# Patient Record
Sex: Male | Born: 1985
Health system: Southern US, Community
[De-identification: ages and names within clinical notes are randomized; demographics above are authoritative.]

## PROBLEM LIST (undated history)

## (undated) DIAGNOSIS — K219 Gastro-esophageal reflux disease without esophagitis: Secondary | ICD-10-CM

## (undated) DIAGNOSIS — R110 Nausea: Secondary | ICD-10-CM

## (undated) DIAGNOSIS — F418 Other specified anxiety disorders: Secondary | ICD-10-CM

## (undated) DIAGNOSIS — Z6834 Body mass index (BMI) 34.0-34.9, adult: Secondary | ICD-10-CM

## (undated) DIAGNOSIS — F5101 Primary insomnia: Secondary | ICD-10-CM

## (undated) DIAGNOSIS — E782 Mixed hyperlipidemia: Secondary | ICD-10-CM

## (undated) DIAGNOSIS — G4733 Obstructive sleep apnea (adult) (pediatric): Secondary | ICD-10-CM

## (undated) DIAGNOSIS — F329 Major depressive disorder, single episode, unspecified: Secondary | ICD-10-CM

## (undated) DIAGNOSIS — Z9989 Dependence on other enabling machines and devices: Secondary | ICD-10-CM

## (undated) DIAGNOSIS — E291 Testicular hypofunction: Secondary | ICD-10-CM

## (undated) DIAGNOSIS — R748 Abnormal levels of other serum enzymes: Secondary | ICD-10-CM

## (undated) HISTORY — PX: OTHER SURGICAL HISTORY: SHX169

## (undated) HISTORY — DX: Obstructive sleep apnea (adult) (pediatric): Z99.89

## (undated) HISTORY — DX: Abnormal levels of other serum enzymes: R74.8

## (undated) HISTORY — DX: Gastro-esophageal reflux disease without esophagitis: K21.9

## (undated) HISTORY — DX: Obstructive sleep apnea (adult) (pediatric): G47.33

## (undated) HISTORY — PX: SHOULDER SURGERY: SHX246

---
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
## 2003-09-26 ENCOUNTER — Emergency Department (HOSPITAL_COMMUNITY): Admission: EM | Admit: 2003-09-26 | Discharge: 2003-09-26 | Payer: Self-pay | Admitting: Emergency Medicine

## 2005-02-11 ENCOUNTER — Encounter: Admission: RE | Admit: 2005-02-11 | Discharge: 2005-02-11 | Payer: Self-pay | Admitting: Internal Medicine

## 2007-01-18 ENCOUNTER — Emergency Department (HOSPITAL_COMMUNITY): Admission: EM | Admit: 2007-01-18 | Discharge: 2007-01-18 | Payer: Self-pay | Admitting: *Deleted

## 2010-04-25 ENCOUNTER — Emergency Department (HOSPITAL_COMMUNITY)
Admission: EM | Admit: 2010-04-25 | Discharge: 2010-04-25 | Payer: Self-pay | Source: Home / Self Care | Admitting: Emergency Medicine

## 2010-09-12 LAB — BASIC METABOLIC PANEL
BUN: 10 mg/dL (ref 6–23)
CO2: 28 mEq/L (ref 19–32)
Calcium: 9.5 mg/dL (ref 8.4–10.5)
Chloride: 104 mEq/L (ref 96–112)
Creatinine, Ser: 0.92 mg/dL (ref 0.4–1.5)
GFR calc Af Amer: >60 mL/min (ref >60)
GFR calc non Af Amer: >60 mL/min (ref >60)
Glucose, Bld: 101 mg/dL — ABNORMAL HIGH (ref 70–99)
Potassium: 3.8 mEq/L (ref 3.5–5.1)
Sodium: 140 mEq/L (ref 135–145)

## 2010-09-12 LAB — CBC
HCT: 42.9 % (ref 39.0–52.0)
Hemoglobin: 14.6 g/dL (ref 13.0–17.0)
MCH: 29 pg (ref 26.0–34.0)
MCHC: 33.9 g/dL (ref 30.0–36.0)
MCV: 85.5 fL (ref 78.0–100.0)
Platelets: 267 10*3/uL (ref 150–400)
RBC: 5.02 MIL/uL (ref 4.22–5.81)
RDW: 13.4 % (ref 11.5–15.5)
WBC: 10.4 10*3/uL (ref 4.0–10.5)

## 2010-09-12 LAB — DIFFERENTIAL
Basophils Absolute: 0 10*3/uL (ref 0.0–0.1)
Basophils Relative: 0 % (ref 0–1)
Eosinophils Absolute: 0 10*3/uL (ref 0.0–0.7)
Eosinophils Relative: 0 % (ref 0–5)
Lymphocytes Relative: 11 % — ABNORMAL LOW (ref 12–46)
Lymphs Abs: 1.2 10*3/uL (ref 0.7–4.0)
Monocytes Absolute: 0.3 10*3/uL (ref 0.1–1.0)
Monocytes Relative: 3 % (ref 3–12)
Neutro Abs: 8.9 10*3/uL — ABNORMAL HIGH (ref 1.7–7.7)
Neutrophils Relative %: 85 % — ABNORMAL HIGH (ref 43–77)

## 2010-09-12 LAB — POCT CARDIAC MARKERS
CKMB, poc: <1 ng/mL — ABNORMAL LOW (ref 1.0–8.0)
Myoglobin, poc: 53.6 ng/mL (ref 12–200)
Troponin i, poc: <0.05 ng/mL (ref 0.00–0.09)

## 2010-09-12 LAB — URINALYSIS, ROUTINE W REFLEX MICROSCOPIC
Bilirubin Urine: NEGATIVE
Glucose, UA: NEGATIVE mg/dL
Hgb urine dipstick: NEGATIVE
Ketones, ur: NEGATIVE mg/dL
Nitrite: NEGATIVE
Protein, ur: NEGATIVE mg/dL
Specific Gravity, Urine: 1.003 — ABNORMAL LOW (ref 1.005–1.030)
Urobilinogen, UA: 0.2 mg/dL (ref 0.0–1.0)
pH: 7 (ref 5.0–8.0)

## 2010-09-12 LAB — GLUCOSE, CAPILLARY: Glucose-Capillary: 107 mg/dL — ABNORMAL HIGH (ref 70–99)

## 2011-04-15 LAB — DIFFERENTIAL
Basophils Absolute: 0
Basophils Relative: 0
Eosinophils Absolute: 0
Eosinophils Relative: 0
Lymphocytes Relative: 41
Lymphs Abs: 1.7
Monocytes Absolute: 0.6
Monocytes Relative: 14 — ABNORMAL HIGH
Neutro Abs: 1.9
Neutrophils Relative %: 45

## 2011-04-15 LAB — URINALYSIS, ROUTINE W REFLEX MICROSCOPIC
Bilirubin Urine: NEGATIVE
Glucose, UA: NEGATIVE
Hgb urine dipstick: NEGATIVE
Ketones, ur: NEGATIVE
Nitrite: NEGATIVE
Protein, ur: NEGATIVE
Specific Gravity, Urine: 1.004 — ABNORMAL LOW
Urobilinogen, UA: 0.2
pH: 6

## 2011-04-15 LAB — CBC
HCT: 39
Hemoglobin: 13.5
MCHC: 34.6
MCV: 82.9
Platelets: 144 — ABNORMAL LOW
RBC: 4.71
RDW: 13.4
WBC: 4.2

## 2011-07-02 HISTORY — PX: ROTATOR CUFF REPAIR: SHX139

## 2012-01-15 ENCOUNTER — Other Ambulatory Visit: Payer: Self-pay | Admitting: Orthopaedic Surgery

## 2012-01-15 DIAGNOSIS — M25511 Pain in right shoulder: Secondary | ICD-10-CM

## 2012-01-23 ENCOUNTER — Ambulatory Visit
Admission: RE | Admit: 2012-01-23 | Discharge: 2012-01-23 | Disposition: A | Discharge status: Home / Self Care | Payer: 59 | Source: Ambulatory Visit | Attending: Orthopaedic Surgery | Admitting: Orthopaedic Surgery

## 2012-01-23 DIAGNOSIS — M25511 Pain in right shoulder: Secondary | ICD-10-CM

## 2012-01-23 MED ORDER — IOHEXOL 180 MG/ML  SOLN
20.0000 mL | Freq: Once | INTRAMUSCULAR | Status: AC | PRN
  Administered 2012-01-23: 20 mL via INTRA_ARTICULAR

## 2012-09-13 ENCOUNTER — Encounter (HOSPITAL_COMMUNITY): Payer: Self-pay | Admitting: *Deleted

## 2012-09-13 ENCOUNTER — Emergency Department (HOSPITAL_COMMUNITY)
Admission: EM | Admit: 2012-09-13 | Discharge: 2012-09-14 | Disposition: A | Discharge status: Home / Self Care | Payer: Worker's Compensation | Attending: Emergency Medicine | Admitting: Emergency Medicine

## 2012-09-13 ENCOUNTER — Emergency Department (HOSPITAL_COMMUNITY): Payer: Worker's Compensation

## 2012-09-13 DIAGNOSIS — S5000XA Contusion of unspecified elbow, initial encounter: Secondary | ICD-10-CM | POA: Insufficient documentation

## 2012-09-13 DIAGNOSIS — S60222A Contusion of left hand, initial encounter: Secondary | ICD-10-CM

## 2012-09-13 DIAGNOSIS — S60229A Contusion of unspecified hand, initial encounter: Secondary | ICD-10-CM | POA: Insufficient documentation

## 2012-09-13 DIAGNOSIS — S5002XA Contusion of left elbow, initial encounter: Secondary | ICD-10-CM

## 2012-09-13 MED ORDER — IBUPROFEN 200 MG PO TABS
400.0000 mg | ORAL_TABLET | Freq: Once | ORAL | Status: AC
  Administered 2012-09-13: 400 mg via ORAL
  Filled 2012-09-13: qty 2
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
## 2012-09-14 NOTE — ED Notes (Signed)
Ortho tech paged for sling immobilizer 

## 2012-09-14 NOTE — ED Provider Notes (Signed)
Medical screening examination/treatment/procedure(s) were performed by non-physician practitioner and as supervising physician I was immediately available for consultation/collaboration.  Lyanne Co, MD 09/14/12 [phone]

## 2012-09-14 NOTE — Progress Notes (Signed)
Orthopedic Tech Progress Note Patient Details:  Erik Walker 01-08-1986 [id number]  Ortho Devices Type of Ortho Device: Sling immobilizer   Haskell Flirt 09/14/2012, 12:32 AM

## 2012-09-15 ENCOUNTER — Other Ambulatory Visit: Payer: Self-pay | Admitting: Orthopaedic Surgery

## 2012-09-15 DIAGNOSIS — M25511 Pain in right shoulder: Secondary | ICD-10-CM

## 2012-09-29 ENCOUNTER — Ambulatory Visit
Admission: RE | Admit: 2012-09-29 | Discharge: 2012-09-29 | Disposition: A | Discharge status: Home / Self Care | Payer: 59 | Source: Ambulatory Visit | Attending: Orthopaedic Surgery | Admitting: Orthopaedic Surgery

## 2012-09-29 DIAGNOSIS — M25511 Pain in right shoulder: Secondary | ICD-10-CM

## 2012-09-29 MED ORDER — IOHEXOL 180 MG/ML  SOLN
15.0000 mL | Freq: Once | INTRAMUSCULAR | Status: AC | PRN
  Administered 2012-09-29: 15 mL via INTRA_ARTICULAR

## 2013-08-16 DIAGNOSIS — E291 Testicular hypofunction: Secondary | ICD-10-CM

## 2013-08-16 HISTORY — DX: Testicular hypofunction: E29.1

## 2016-01-02 ENCOUNTER — Emergency Department (HOSPITAL_COMMUNITY)
Admission: EM | Admit: 2016-01-02 | Discharge: 2016-01-02 | Disposition: A | Discharge status: Home / Self Care | Payer: 59 | Attending: Emergency Medicine | Admitting: Emergency Medicine

## 2016-01-02 ENCOUNTER — Encounter (HOSPITAL_COMMUNITY): Payer: Self-pay

## 2016-01-02 ENCOUNTER — Emergency Department (HOSPITAL_COMMUNITY): Payer: 59

## 2016-01-02 DIAGNOSIS — Y939 Activity, unspecified: Secondary | ICD-10-CM | POA: Insufficient documentation

## 2016-01-02 DIAGNOSIS — S91112A Laceration without foreign body of left great toe without damage to nail, initial encounter: Secondary | ICD-10-CM

## 2016-01-02 DIAGNOSIS — Y929 Unspecified place or not applicable: Secondary | ICD-10-CM | POA: Insufficient documentation

## 2016-01-02 DIAGNOSIS — Y999 Unspecified external cause status: Secondary | ICD-10-CM | POA: Insufficient documentation

## 2016-01-02 DIAGNOSIS — W25XXXA Contact with sharp glass, initial encounter: Secondary | ICD-10-CM | POA: Insufficient documentation

## 2016-01-02 MED ORDER — LIDOCAINE HCL (PF) 1 % IJ SOLN
5.0000 mL | Freq: Once | INTRAMUSCULAR | Status: AC
  Administered 2016-01-02: 5 mL via INTRADERMAL
  Filled 2016-01-02: qty 30

## 2016-01-02 MED ORDER — TETANUS-DIPHTH-ACELL PERTUSSIS 5-2.5-18.5 LF-MCG/0.5 IM SUSP
0.5000 mL | Freq: Once | INTRAMUSCULAR | Status: AC
  Administered 2016-01-02: 0.5 mL via INTRAMUSCULAR
  Filled 2016-01-02: qty 0.5

## 2016-01-02 MED ORDER — CEPHALEXIN 500 MG PO CAPS
500.0000 mg | ORAL_CAPSULE | Freq: Two times a day (BID) | ORAL | Status: DC
Start: 2016-01-02 — End: 2018-04-28

## 2016-01-02 NOTE — ED Provider Notes (Signed)
CSN: [id number]     Arrival date & time 01/02/16  1551 History  By signing my name below, I, Evelene Croon, attest that this documentation has been prepared under the direction and in the presence of Elnora Morrison, MD . Electronically Signed: Evelene Croon, Scribe. 01/02/2016. 5:26 PM.    Chief Complaint  Patient presents with  . Extremity Laceration    The history is provided by the patient. No language interpreter was used.    HPI Comments:  Erik Walker is a 30 y.o. male who presents to the Emergency Department complaining of a laceration to the top of left great toe . Pt states he stepped on a piece of glass ~ 2 hours ago. He notes mild pain to the site. Tetanus is not UTD. Pt has no other complaints or symptoms at this time.    History reviewed. No pertinent past medical history. Past Surgical History  Procedure Laterality Date  . Shoulder surgery      RIGHT x2   History reviewed. No pertinent family history. Social History  Substance Use Topics  . Smoking status: Never Smoker   . Smokeless tobacco: None  . Alcohol Use: No    Review of Systems  Constitutional: Negative for fever and chills.  Respiratory: Negative for shortness of breath.   Cardiovascular: Negative for chest pain.  Skin: Positive for wound.  All other systems reviewed and are negative.   Allergies  Review of patient's allergies indicates no known allergies.  Home Medications   Prior to Admission medications   Medication Sig Start Date End Date Taking? Authorizing Provider  venlafaxine XR (EFFEXOR-XR) 75 MG 24 hr capsule Take 75 mg by mouth daily.    Historical Provider, MD   BP 130/81 mmHg  Pulse 96  Temp(Src) 98.6 F (37 C) (Oral)  Resp 15  Ht 5\' 10"  (1.778 m)  Wt 195 lb (88.451 kg)  BMI 27.98 kg/m2  SpO2 98% Physical Exam  Constitutional: He is oriented to person, place, and time. He appears well-developed and well-nourished. No distress.  HENT:  Head: Normocephalic and atraumatic.   Eyes: Conjunctivae are normal.  Cardiovascular: Normal rate.   Pulmonary/Chest: Effort normal.  Abdominal: He exhibits no distension.  Musculoskeletal:  5+ strength w DF and plantar flexion of great toe, 2 cm lac lateral great toe. Nl cap refill  Neurological: He is alert and oriented to person, place, and time.  Skin: Skin is warm and dry.  Psychiatric: He has a normal mood and affect.  Nursing note and vitals reviewed.   ED Course  Procedures   DIAGNOSTIC STUDIES:  Oxygen Saturation is 98% on RA, normal by my interpretation.    COORDINATION OF CARE:  5:25 PM Will repair laceration and update tetanus.  Discussed treatment plan with pt at bedside and pt agreed to plan.  Labs Review Labs Reviewed - No data to display  Imaging Review Dg Toe Great Left  01/02/2016  CLINICAL DATA:  Laceration from shattered glass EXAM: LEFT FIRST TOE:  3 V COMPARISON:  None. FINDINGS: Frontal, oblique, and lateral views obtained. There is soft tissue injury medial to the first IP joint. No radiopaque foreign body evident. No fracture or dislocation. The joint spaces appear normal. No erosive change. IMPRESSION: Soft tissue injury medial to the first IP joint. No radiopaque foreign body. No fracture or dislocation. No apparent arthropathy. Electronically Signed   By: Lowella Grip III M.D.   On: 01/02/2016 17:02   I have personally reviewed and  evaluated these images and lab results as part of my medical decision-making.   EKG Interpretation None       LACERATION REPAIR PROCEDURE NOTE The patient's identification was confirmed and consent was obtained. This procedure was performed by Elnora Morrison, MD at 5:26 PM. Site: great toe left Sterile procedures observed yes Anesthetic used (type and amt): lido  Suture type/size:4-0 Length:3 # of Sutures: 5 Technique:inter Complexity Antibx ointment applied no Tetanus UTD or orderedyes Site anesthetized, irrigated with NS, explored without  evidence of foreign body, wound well approximated, site covered with dry, sterile dressing.  Patient tolerated procedure well without complications. Instructions for care discussed verbally and patient provided with additional written instructions for homecare and f/u.   MDM   Final diagnoses:  None   Patient presents with isolated laceration to great toe. Wound cleaned and repaired in the ER. Tetanus updated. Reasons to return discussed  Results and differential diagnosis were discussed with the patient/parent/guardian. Xrays were independently reviewed by myself.  Close follow up outpatient was discussed, comfortable with the plan.   Medications  lidocaine (PF) (XYLOCAINE) 1 % injection 5 mL (not administered)  Tdap (BOOSTRIX) injection 0.5 mL (not administered)    Filed Vitals:   01/02/16 1602  BP: 130/81  Pulse: 96  Temp: 98.6 F (37 C)  TempSrc: Oral  Resp: 15  Height: 5\' 10"  (1.778 m)  Weight: 195 lb (88.451 kg)  SpO2: 98%    Final diagnoses:  Laceration of great toe, left, initial encounter       Elnora Morrison, MD 01/02/16 [phone]

## 2016-01-02 NOTE — Discharge Instructions (Signed)
If you were given medicines take as directed.  If you are on coumadin or contraceptives realize their levels and effectiveness is altered by many different medicines.  If you have any reaction (rash, tongues swelling, other) to the medicines stop taking and see a physician.   Stitches removed in 10 days.  Keep clean, no pools or bath tubs.    If your blood pressure was elevated in the ER make sure you follow up for management with a primary doctor or return for chest pain, shortness of breath or stroke symptoms.  Please follow up as directed and return to the ER or see a physician for new or worsening symptoms.  Thank you. Filed Vitals:   01/02/16 1602  BP: 130/81  Pulse: 96  Temp: 98.6 F (37 C)  TempSrc: Oral  Resp: 15  Height: 5\' 10"  (1.778 m)  Weight: 195 lb (88.451 kg)  SpO2: 98%

## 2016-01-02 NOTE — ED Notes (Signed)
Dressing applied.  Wound care instruction given.
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
## 2016-10-31 DIAGNOSIS — E291 Testicular hypofunction: Secondary | ICD-10-CM | POA: Diagnosis not present

## 2016-11-05 DIAGNOSIS — J0100 Acute maxillary sinusitis, unspecified: Secondary | ICD-10-CM | POA: Diagnosis not present

## 2016-11-27 DIAGNOSIS — M778 Other enthesopathies, not elsewhere classified: Secondary | ICD-10-CM | POA: Diagnosis not present

## 2016-11-28 DIAGNOSIS — R110 Nausea: Secondary | ICD-10-CM | POA: Diagnosis not present

## 2016-11-28 DIAGNOSIS — K5909 Other constipation: Secondary | ICD-10-CM | POA: Diagnosis not present

## 2016-12-25 DIAGNOSIS — M778 Other enthesopathies, not elsewhere classified: Secondary | ICD-10-CM | POA: Diagnosis not present

## 2016-12-28 DIAGNOSIS — M25532 Pain in left wrist: Secondary | ICD-10-CM | POA: Diagnosis not present

## 2017-02-05 DIAGNOSIS — M778 Other enthesopathies, not elsewhere classified: Secondary | ICD-10-CM | POA: Diagnosis not present

## 2017-03-27 DIAGNOSIS — M25532 Pain in left wrist: Secondary | ICD-10-CM | POA: Diagnosis not present

## 2017-05-01 DIAGNOSIS — E291 Testicular hypofunction: Secondary | ICD-10-CM | POA: Diagnosis not present

## 2017-05-01 DIAGNOSIS — Z0000 Encounter for general adult medical examination without abnormal findings: Secondary | ICD-10-CM | POA: Diagnosis not present

## 2017-05-02 DIAGNOSIS — S63592A Other specified sprain of left wrist, initial encounter: Secondary | ICD-10-CM | POA: Diagnosis not present

## 2017-05-02 DIAGNOSIS — M24132 Other articular cartilage disorders, left wrist: Secondary | ICD-10-CM | POA: Diagnosis not present

## 2017-05-07 DIAGNOSIS — E291 Testicular hypofunction: Secondary | ICD-10-CM | POA: Diagnosis not present

## 2017-05-12 DIAGNOSIS — S63592A Other specified sprain of left wrist, initial encounter: Secondary | ICD-10-CM | POA: Diagnosis not present

## 2017-05-14 DIAGNOSIS — M25532 Pain in left wrist: Secondary | ICD-10-CM | POA: Diagnosis not present

## 2017-05-14 DIAGNOSIS — S63592D Other specified sprain of left wrist, subsequent encounter: Secondary | ICD-10-CM | POA: Diagnosis not present

## 2017-05-19 DIAGNOSIS — S63592D Other specified sprain of left wrist, subsequent encounter: Secondary | ICD-10-CM | POA: Diagnosis not present

## 2017-05-19 DIAGNOSIS — M25532 Pain in left wrist: Secondary | ICD-10-CM | POA: Diagnosis not present

## 2017-05-26 DIAGNOSIS — M25532 Pain in left wrist: Secondary | ICD-10-CM | POA: Diagnosis not present

## 2017-05-26 DIAGNOSIS — S63592D Other specified sprain of left wrist, subsequent encounter: Secondary | ICD-10-CM | POA: Diagnosis not present

## 2017-06-03 DIAGNOSIS — S63592D Other specified sprain of left wrist, subsequent encounter: Secondary | ICD-10-CM | POA: Diagnosis not present

## 2017-06-03 DIAGNOSIS — M25532 Pain in left wrist: Secondary | ICD-10-CM | POA: Diagnosis not present

## 2017-06-16 DIAGNOSIS — S63592D Other specified sprain of left wrist, subsequent encounter: Secondary | ICD-10-CM | POA: Diagnosis not present

## 2017-06-16 DIAGNOSIS — M25532 Pain in left wrist: Secondary | ICD-10-CM | POA: Diagnosis not present

## 2017-06-25 DIAGNOSIS — F418 Other specified anxiety disorders: Secondary | ICD-10-CM

## 2017-06-25 DIAGNOSIS — F329 Major depressive disorder, single episode, unspecified: Secondary | ICD-10-CM

## 2017-06-25 HISTORY — DX: Major depressive disorder, single episode, unspecified: F32.9

## 2017-06-25 HISTORY — DX: Other specified anxiety disorders: F41.8

## 2017-06-26 DIAGNOSIS — M25532 Pain in left wrist: Secondary | ICD-10-CM | POA: Diagnosis not present

## 2017-06-26 DIAGNOSIS — S63592D Other specified sprain of left wrist, subsequent encounter: Secondary | ICD-10-CM | POA: Diagnosis not present

## 2017-07-03 DIAGNOSIS — M25532 Pain in left wrist: Secondary | ICD-10-CM | POA: Diagnosis not present

## 2017-07-03 DIAGNOSIS — S63592D Other specified sprain of left wrist, subsequent encounter: Secondary | ICD-10-CM | POA: Diagnosis not present

## 2017-07-07 DIAGNOSIS — S63592D Other specified sprain of left wrist, subsequent encounter: Secondary | ICD-10-CM | POA: Diagnosis not present

## 2017-07-07 DIAGNOSIS — M25532 Pain in left wrist: Secondary | ICD-10-CM | POA: Diagnosis not present

## 2017-07-09 DIAGNOSIS — S63592D Other specified sprain of left wrist, subsequent encounter: Secondary | ICD-10-CM | POA: Diagnosis not present

## 2017-07-09 DIAGNOSIS — M25532 Pain in left wrist: Secondary | ICD-10-CM | POA: Diagnosis not present

## 2017-07-16 DIAGNOSIS — M25532 Pain in left wrist: Secondary | ICD-10-CM | POA: Diagnosis not present

## 2017-07-16 DIAGNOSIS — S63592D Other specified sprain of left wrist, subsequent encounter: Secondary | ICD-10-CM | POA: Diagnosis not present

## 2017-07-21 DIAGNOSIS — M25532 Pain in left wrist: Secondary | ICD-10-CM | POA: Diagnosis not present

## 2017-07-21 DIAGNOSIS — S63592D Other specified sprain of left wrist, subsequent encounter: Secondary | ICD-10-CM | POA: Diagnosis not present

## 2017-07-23 DIAGNOSIS — S63592D Other specified sprain of left wrist, subsequent encounter: Secondary | ICD-10-CM | POA: Diagnosis not present

## 2017-07-23 DIAGNOSIS — M25532 Pain in left wrist: Secondary | ICD-10-CM | POA: Diagnosis not present

## 2017-07-28 DIAGNOSIS — S63592D Other specified sprain of left wrist, subsequent encounter: Secondary | ICD-10-CM | POA: Diagnosis not present

## 2017-07-28 DIAGNOSIS — M25532 Pain in left wrist: Secondary | ICD-10-CM | POA: Diagnosis not present

## 2017-07-30 DIAGNOSIS — S63592D Other specified sprain of left wrist, subsequent encounter: Secondary | ICD-10-CM | POA: Diagnosis not present

## 2017-07-30 DIAGNOSIS — M25532 Pain in left wrist: Secondary | ICD-10-CM | POA: Diagnosis not present

## 2017-08-04 DIAGNOSIS — S63592D Other specified sprain of left wrist, subsequent encounter: Secondary | ICD-10-CM | POA: Diagnosis not present

## 2017-08-04 DIAGNOSIS — M25532 Pain in left wrist: Secondary | ICD-10-CM | POA: Diagnosis not present

## 2017-08-06 DIAGNOSIS — M25532 Pain in left wrist: Secondary | ICD-10-CM | POA: Diagnosis not present

## 2017-08-06 DIAGNOSIS — S63592D Other specified sprain of left wrist, subsequent encounter: Secondary | ICD-10-CM | POA: Diagnosis not present

## 2017-08-11 DIAGNOSIS — S63592D Other specified sprain of left wrist, subsequent encounter: Secondary | ICD-10-CM | POA: Diagnosis not present

## 2017-08-11 DIAGNOSIS — M25532 Pain in left wrist: Secondary | ICD-10-CM | POA: Diagnosis not present

## 2017-08-13 DIAGNOSIS — M25532 Pain in left wrist: Secondary | ICD-10-CM | POA: Diagnosis not present

## 2017-08-13 DIAGNOSIS — S63592D Other specified sprain of left wrist, subsequent encounter: Secondary | ICD-10-CM | POA: Diagnosis not present

## 2017-08-14 DIAGNOSIS — M25532 Pain in left wrist: Secondary | ICD-10-CM | POA: Diagnosis not present

## 2017-09-10 DIAGNOSIS — M25532 Pain in left wrist: Secondary | ICD-10-CM | POA: Diagnosis not present

## 2017-10-15 DIAGNOSIS — E291 Testicular hypofunction: Secondary | ICD-10-CM | POA: Diagnosis not present

## 2017-10-21 DIAGNOSIS — E291 Testicular hypofunction: Secondary | ICD-10-CM | POA: Diagnosis not present

## 2017-10-28 DIAGNOSIS — M9903 Segmental and somatic dysfunction of lumbar region: Secondary | ICD-10-CM | POA: Diagnosis not present

## 2017-10-28 DIAGNOSIS — M25532 Pain in left wrist: Secondary | ICD-10-CM | POA: Diagnosis not present

## 2017-10-28 DIAGNOSIS — M62830 Muscle spasm of back: Secondary | ICD-10-CM | POA: Diagnosis not present

## 2017-10-30 DIAGNOSIS — E291 Testicular hypofunction: Secondary | ICD-10-CM | POA: Diagnosis not present

## 2017-10-30 DIAGNOSIS — M62830 Muscle spasm of back: Secondary | ICD-10-CM | POA: Diagnosis not present

## 2017-10-30 DIAGNOSIS — R400 Somnolence: Secondary | ICD-10-CM | POA: Diagnosis not present

## 2017-10-30 DIAGNOSIS — M9903 Segmental and somatic dysfunction of lumbar region: Secondary | ICD-10-CM | POA: Diagnosis not present

## 2017-10-31 DIAGNOSIS — M62830 Muscle spasm of back: Secondary | ICD-10-CM | POA: Diagnosis not present

## 2017-10-31 DIAGNOSIS — M9903 Segmental and somatic dysfunction of lumbar region: Secondary | ICD-10-CM | POA: Diagnosis not present

## 2017-11-10 DIAGNOSIS — M25511 Pain in right shoulder: Secondary | ICD-10-CM | POA: Diagnosis not present

## 2017-11-10 DIAGNOSIS — M542 Cervicalgia: Secondary | ICD-10-CM | POA: Diagnosis not present

## 2017-12-15 DIAGNOSIS — M62830 Muscle spasm of back: Secondary | ICD-10-CM | POA: Diagnosis not present

## 2017-12-15 DIAGNOSIS — M9903 Segmental and somatic dysfunction of lumbar region: Secondary | ICD-10-CM | POA: Diagnosis not present

## 2017-12-19 DIAGNOSIS — M9903 Segmental and somatic dysfunction of lumbar region: Secondary | ICD-10-CM | POA: Diagnosis not present

## 2017-12-19 DIAGNOSIS — M62830 Muscle spasm of back: Secondary | ICD-10-CM | POA: Diagnosis not present

## 2017-12-24 DIAGNOSIS — M25532 Pain in left wrist: Secondary | ICD-10-CM | POA: Diagnosis not present

## 2017-12-24 DIAGNOSIS — M62830 Muscle spasm of back: Secondary | ICD-10-CM | POA: Diagnosis not present

## 2017-12-24 DIAGNOSIS — M9903 Segmental and somatic dysfunction of lumbar region: Secondary | ICD-10-CM | POA: Diagnosis not present

## 2017-12-26 DIAGNOSIS — G4730 Sleep apnea, unspecified: Secondary | ICD-10-CM | POA: Diagnosis not present

## 2018-01-05 DIAGNOSIS — M62830 Muscle spasm of back: Secondary | ICD-10-CM | POA: Diagnosis not present

## 2018-01-05 DIAGNOSIS — M9903 Segmental and somatic dysfunction of lumbar region: Secondary | ICD-10-CM | POA: Diagnosis not present

## 2018-01-07 DIAGNOSIS — M62830 Muscle spasm of back: Secondary | ICD-10-CM | POA: Diagnosis not present

## 2018-01-07 DIAGNOSIS — M9903 Segmental and somatic dysfunction of lumbar region: Secondary | ICD-10-CM | POA: Diagnosis not present

## 2018-01-08 DIAGNOSIS — M9903 Segmental and somatic dysfunction of lumbar region: Secondary | ICD-10-CM | POA: Diagnosis not present

## 2018-01-08 DIAGNOSIS — M62830 Muscle spasm of back: Secondary | ICD-10-CM | POA: Diagnosis not present

## 2018-01-26 DIAGNOSIS — M9903 Segmental and somatic dysfunction of lumbar region: Secondary | ICD-10-CM | POA: Diagnosis not present

## 2018-01-26 DIAGNOSIS — M62830 Muscle spasm of back: Secondary | ICD-10-CM | POA: Diagnosis not present

## 2018-02-18 DIAGNOSIS — K118 Other diseases of salivary glands: Secondary | ICD-10-CM | POA: Diagnosis not present

## 2018-02-25 DIAGNOSIS — M25532 Pain in left wrist: Secondary | ICD-10-CM | POA: Diagnosis not present

## 2018-03-18 DIAGNOSIS — M9903 Segmental and somatic dysfunction of lumbar region: Secondary | ICD-10-CM | POA: Diagnosis not present

## 2018-03-18 DIAGNOSIS — M9902 Segmental and somatic dysfunction of thoracic region: Secondary | ICD-10-CM | POA: Diagnosis not present

## 2018-03-18 DIAGNOSIS — M5414 Radiculopathy, thoracic region: Secondary | ICD-10-CM | POA: Diagnosis not present

## 2018-03-19 DIAGNOSIS — G4700 Insomnia, unspecified: Secondary | ICD-10-CM | POA: Diagnosis not present

## 2018-03-19 DIAGNOSIS — Z23 Encounter for immunization: Secondary | ICD-10-CM | POA: Diagnosis not present

## 2018-03-19 DIAGNOSIS — G4733 Obstructive sleep apnea (adult) (pediatric): Secondary | ICD-10-CM | POA: Diagnosis not present

## 2018-03-24 DIAGNOSIS — M9903 Segmental and somatic dysfunction of lumbar region: Secondary | ICD-10-CM | POA: Diagnosis not present

## 2018-03-24 DIAGNOSIS — M9902 Segmental and somatic dysfunction of thoracic region: Secondary | ICD-10-CM | POA: Diagnosis not present

## 2018-03-24 DIAGNOSIS — M5414 Radiculopathy, thoracic region: Secondary | ICD-10-CM | POA: Diagnosis not present

## 2018-03-31 DIAGNOSIS — M5414 Radiculopathy, thoracic region: Secondary | ICD-10-CM | POA: Diagnosis not present

## 2018-03-31 DIAGNOSIS — M9902 Segmental and somatic dysfunction of thoracic region: Secondary | ICD-10-CM | POA: Diagnosis not present

## 2018-03-31 DIAGNOSIS — M9903 Segmental and somatic dysfunction of lumbar region: Secondary | ICD-10-CM | POA: Diagnosis not present

## 2018-04-08 ENCOUNTER — Institutional Professional Consult (permissible substitution): Payer: 59 | Admitting: Pulmonary Disease

## 2018-04-10 DIAGNOSIS — M9903 Segmental and somatic dysfunction of lumbar region: Secondary | ICD-10-CM | POA: Diagnosis not present

## 2018-04-10 DIAGNOSIS — M5414 Radiculopathy, thoracic region: Secondary | ICD-10-CM | POA: Diagnosis not present

## 2018-04-10 DIAGNOSIS — M9902 Segmental and somatic dysfunction of thoracic region: Secondary | ICD-10-CM | POA: Diagnosis not present

## 2018-04-16 DIAGNOSIS — F5101 Primary insomnia: Secondary | ICD-10-CM | POA: Diagnosis not present

## 2018-04-24 DIAGNOSIS — M25532 Pain in left wrist: Secondary | ICD-10-CM | POA: Diagnosis not present

## 2018-04-28 ENCOUNTER — Ambulatory Visit (INDEPENDENT_AMBULATORY_CARE_PROVIDER_SITE_OTHER): Payer: 59 | Admitting: Pulmonary Disease

## 2018-04-28 VITALS — BP 116/78 | HR 73 | Ht 70.0 in | Wt 201.0 lb

## 2018-04-28 DIAGNOSIS — G4733 Obstructive sleep apnea (adult) (pediatric): Secondary | ICD-10-CM

## 2018-04-28 DIAGNOSIS — F5101 Primary insomnia: Secondary | ICD-10-CM

## 2018-04-28 HISTORY — DX: Primary insomnia: F51.01

## 2018-04-28 NOTE — Progress Notes (Signed)
Erik Walker    [id number]    [birth year]  Primary Care Physician:Pharr, Thayer Jew, MD  Referring Physician: Deland Pretty, MD [address] [place], [place] [address]  Chief complaint:  History of mild obstructive sleep apnea Multiple sleep awakenings  HPI:  Patient with a history of obstructive sleep apnea diagnosed on a recent home sleep study He does night shifts about 14 days a month-7-7 shift He is able to sleep following his shifts, wakes up frequently up to 4-5 times Would usually try to get in bed between 9 and 10 Takes him about 10 minutes to fall asleep, gets up at 7  Gained about 20 pounds recently  Occupation: Corporate treasurer Smoking history: Never smoker  Outpatient Encounter Medications as of 04/28/2018  Medication Sig  . venlafaxine XR (EFFEXOR-XR) 75 MG 24 hr capsule Take 75 mg by mouth daily.  . [DISCONTINUED] cephALEXin (KEFLEX) 500 MG capsule Take 1 capsule (500 mg total) by mouth 2 (two) times daily.   No facility-administered encounter medications on file as of 04/28/2018.     Allergies as of 04/28/2018  . (No Known Allergies)    No past medical history on file.  Past Surgical History:  Procedure Laterality Date  . SHOULDER SURGERY     RIGHT x2    No family history on file.  Social History   Socioeconomic History  . Marital status: Single    Spouse name: Not on file  . Number of children: Not on file  . Years of education: Not on file  . Highest education level: Not on file  Occupational History  . Not on file  Social Needs  . Financial resource strain: Not on file  . Food insecurity:    Worry: Not on file    Inability: Not on file  . Transportation needs:    Medical: Not on file    Non-medical: Not on file  Tobacco Use  . Smoking status: Never Smoker  Substance and Sexual Activity  . Alcohol use: No  . Drug use: No  . Sexual activity: Not on file  Lifestyle  . Physical activity:    Days per week:  Not on file    Minutes per session: Not on file  . Stress: Not on file  Relationships  . Social connections:    Talks on phone: Not on file    Gets together: Not on file    Attends religious service: Not on file    Active member of club or organization: Not on file    Attends meetings of clubs or organizations: Not on file    Relationship status: Not on file  . Intimate partner violence:    Fear of current or ex partner: Not on file    Emotionally abused: Not on file    Physically abused: Not on file    Forced sexual activity: Not on file  Other Topics Concern  . Not on file  Social History Narrative  . Not on file    Review of Systems  Constitutional: Negative for appetite change.  HENT: Negative.   Eyes: Negative.   Respiratory: Positive for apnea. Negative for shortness of breath.   Cardiovascular: Negative.   Endocrine: Negative.   Genitourinary: Negative.   Psychiatric/Behavioral: Positive for sleep disturbance.    Vitals:   04/28/18 1525  BP: 116/78  Pulse: 73  SpO2: 94%     Physical Exam  Constitutional: He appears well-developed and well-nourished.  HENT:  Head: Atraumatic.  Mallampati 2  Eyes: Pupils are equal, round, and reactive to light. Conjunctivae and EOM are normal. Right eye exhibits no discharge. Left eye exhibits no discharge.  Neck: Normal range of motion. Neck supple. No tracheal deviation present.  Cardiovascular: Normal rate and regular rhythm. Exam reveals no friction rub.  No murmur heard. Pulmonary/Chest: Effort normal and breath sounds normal. No respiratory distress. He has no wheezes.  Abdominal: Soft. Bowel sounds are normal.   Data Reviewed: Sleep study results reviewed  Assessment:  Mild obstructive sleep apnea with significant daytime symptoms  Weight gain recently  Plan/Recommendations: Initiate auto titrating CPAP between 5 and 15  Pathophysiology of sleep disordered breathing discussed  Treatment options of sleep  disordered breathing discussed  Encourage weight loss efforts  I will see him back in the office in about 3 months   Sherrilyn Rist MD [facility] 04/28/2018, 3:45 PM  CC: Deland Pretty, MD

## 2018-04-28 NOTE — Patient Instructions (Addendum)
Patient with mild obstructive sleep apnea with daytime symptoms Component of shift work sleep disorder Usually able to get good enough sleep following his work shifts but wakes up many times  I believe treatment of sleep disordered breathing is warranted  We will contact the DME company Start on auto titrating CPAP pressure settings of 5-15  We will see you back in the office in about 2 months, follow-up compliance data

## 2018-04-29 DIAGNOSIS — M67911 Unspecified disorder of synovium and tendon, right shoulder: Secondary | ICD-10-CM | POA: Diagnosis not present

## 2018-05-01 DIAGNOSIS — R110 Nausea: Secondary | ICD-10-CM | POA: Diagnosis not present

## 2018-05-01 DIAGNOSIS — K5900 Constipation, unspecified: Secondary | ICD-10-CM | POA: Diagnosis not present

## 2018-05-05 DIAGNOSIS — M67911 Unspecified disorder of synovium and tendon, right shoulder: Secondary | ICD-10-CM | POA: Diagnosis not present

## 2018-05-13 DIAGNOSIS — D225 Melanocytic nevi of trunk: Secondary | ICD-10-CM | POA: Diagnosis not present

## 2018-05-13 DIAGNOSIS — D485 Neoplasm of uncertain behavior of skin: Secondary | ICD-10-CM | POA: Diagnosis not present

## 2018-05-27 DIAGNOSIS — R0982 Postnasal drip: Secondary | ICD-10-CM | POA: Diagnosis not present

## 2018-05-27 DIAGNOSIS — J069 Acute upper respiratory infection, unspecified: Secondary | ICD-10-CM | POA: Diagnosis not present

## 2018-06-03 DIAGNOSIS — G4733 Obstructive sleep apnea (adult) (pediatric): Secondary | ICD-10-CM | POA: Diagnosis not present

## 2018-06-13 DIAGNOSIS — A09 Infectious gastroenteritis and colitis, unspecified: Secondary | ICD-10-CM | POA: Diagnosis not present

## 2018-07-04 DIAGNOSIS — G4733 Obstructive sleep apnea (adult) (pediatric): Secondary | ICD-10-CM | POA: Diagnosis not present

## 2018-07-29 ENCOUNTER — Ambulatory Visit: Payer: 59 | Admitting: Pulmonary Disease

## 2018-08-04 DIAGNOSIS — G4733 Obstructive sleep apnea (adult) (pediatric): Secondary | ICD-10-CM | POA: Diagnosis not present

## 2018-08-05 ENCOUNTER — Encounter: Payer: Self-pay | Admitting: Pulmonary Disease

## 2018-08-06 ENCOUNTER — Encounter: Payer: Self-pay | Admitting: Pulmonary Disease

## 2018-08-06 ENCOUNTER — Ambulatory Visit: Payer: 59 | Admitting: Pulmonary Disease

## 2018-08-06 VITALS — BP 110/80 | HR 95 | Ht 70.0 in | Wt 208.4 lb

## 2018-08-06 DIAGNOSIS — Z9989 Dependence on other enabling machines and devices: Secondary | ICD-10-CM

## 2018-08-06 DIAGNOSIS — G4733 Obstructive sleep apnea (adult) (pediatric): Secondary | ICD-10-CM | POA: Diagnosis not present

## 2018-08-06 DIAGNOSIS — J069 Acute upper respiratory infection, unspecified: Secondary | ICD-10-CM | POA: Diagnosis not present

## 2018-08-06 NOTE — Patient Instructions (Signed)
Obstructive sleep apnea, adequately treated with CPAP therapy Improvement in symptoms  We will obtain a download from the medical supply company  I will see you back in the office in 6 months Call with any significant concerns

## 2018-08-06 NOTE — Progress Notes (Signed)
Erik Walker    [id number]    [birth year]  Primary Care Physician:Pharr, Thayer Jew, MD  Referring Physician: Deland Pretty, MD [address] [place], [place] [address]  Chief complaint:  History of mild obstructive sleep apnea On CPAP-auto titrating CPAP  HPI:  Patient with a history of obstructive sleep apnea diagnosed on a recent home sleep study He does night shifts about 14 days a month-7-7 shift He is able to sleep following his shifts, wakes up frequently up to 4-5 times Would usually try to get in bed between 9 and 10 Takes him about 10 minutes to fall asleep, gets up at 7  Functioning better Tolerating CPAP well Denies any pain or discomfort  Occupation: Corporate treasurer Smoking history: Never smoker  Outpatient Encounter Medications as of 08/06/2018  Medication Sig  . venlafaxine XR (EFFEXOR-XR) 75 MG 24 hr capsule Take 75 mg by mouth daily.   No facility-administered encounter medications on file as of 08/06/2018.     Allergies as of 08/06/2018  . (No Known Allergies)    History reviewed. No pertinent past medical history.  Past Surgical History:  Procedure Laterality Date  . SHOULDER SURGERY     RIGHT x2    History reviewed. No pertinent family history.  Social History   Socioeconomic History  . Marital status: Single    Spouse name: Not on file  . Number of children: Not on file  . Years of education: Not on file  . Highest education level: Not on file  Occupational History  . Not on file  Social Needs  . Financial resource strain: Not on file  . Food insecurity:    Worry: Not on file    Inability: Not on file  . Transportation needs:    Medical: Not on file    Non-medical: Not on file  Tobacco Use  . Smoking status: Never Smoker  . Smokeless tobacco: Never Used  Substance and Sexual Activity  . Alcohol use: No  . Drug use: No  . Sexual activity: Not on file  Lifestyle  . Physical activity:    Days per week:  Not on file    Minutes per session: Not on file  . Stress: Not on file  Relationships  . Social connections:    Talks on phone: Not on file    Gets together: Not on file    Attends religious service: Not on file    Active member of club or organization: Not on file    Attends meetings of clubs or organizations: Not on file    Relationship status: Not on file  . Intimate partner violence:    Fear of current or ex partner: Not on file    Emotionally abused: Not on file    Physically abused: Not on file    Forced sexual activity: Not on file  Other Topics Concern  . Not on file  Social History Narrative  . Not on file    Review of Systems  Constitutional: Negative for appetite change.  HENT: Negative.   Eyes: Negative.   Respiratory: Positive for apnea. Negative for shortness of breath.   Cardiovascular: Negative.   Endocrine: Negative.   Genitourinary: Negative.   Psychiatric/Behavioral: Positive for sleep disturbance.    Vitals:   08/06/18 0956  BP: 110/80  Pulse: 95  SpO2: 94%     Physical Exam  Constitutional: He appears well-developed and well-nourished.  HENT:  Head: Normocephalic and  atraumatic.  Mallampati 2  Eyes: Pupils are equal, round, and reactive to light. Conjunctivae and EOM are normal. Right eye exhibits no discharge. Left eye exhibits no discharge.  Neck: Normal range of motion. Neck supple. No tracheal deviation present.  Cardiovascular: Normal rate and regular rhythm. Exam reveals no friction rub.  No murmur heard. Pulmonary/Chest: Effort normal and breath sounds normal. No respiratory distress. He has no wheezes.   Data Reviewed: Sleep study results reviewed Compliance download is pending at present  Assessment:  Mild obstructive sleep apnea with significant daytime symptoms -Has been using auto titrating CPAP -Improvement in symptoms   Plan/Recommendations: Continue auto titrating CPAP between 5 and 15  We will see him back in the  office in about 6 months  Encourage weight loss efforts  Call with any significant concerns  Sherrilyn Rist MD [facility] 08/06/2018, 10:02 AM  CC: Deland Pretty, MD

## 2018-08-17 DIAGNOSIS — R1084 Generalized abdominal pain: Secondary | ICD-10-CM | POA: Diagnosis not present

## 2018-08-21 DIAGNOSIS — R110 Nausea: Secondary | ICD-10-CM | POA: Diagnosis not present

## 2018-08-22 DIAGNOSIS — K5289 Other specified noninfective gastroenteritis and colitis: Secondary | ICD-10-CM | POA: Diagnosis not present

## 2018-08-22 DIAGNOSIS — R112 Nausea with vomiting, unspecified: Secondary | ICD-10-CM | POA: Diagnosis not present

## 2018-08-25 DIAGNOSIS — R110 Nausea: Secondary | ICD-10-CM | POA: Diagnosis not present

## 2018-08-25 DIAGNOSIS — K219 Gastro-esophageal reflux disease without esophagitis: Secondary | ICD-10-CM

## 2018-08-25 DIAGNOSIS — R635 Abnormal weight gain: Secondary | ICD-10-CM | POA: Diagnosis not present

## 2018-08-25 HISTORY — DX: Nausea: R11.0

## 2018-08-25 HISTORY — DX: Gastro-esophageal reflux disease without esophagitis: K21.9

## 2018-09-02 DIAGNOSIS — G4733 Obstructive sleep apnea (adult) (pediatric): Secondary | ICD-10-CM | POA: Diagnosis not present

## 2018-09-04 DIAGNOSIS — R110 Nausea: Secondary | ICD-10-CM | POA: Diagnosis not present

## 2018-09-04 DIAGNOSIS — K219 Gastro-esophageal reflux disease without esophagitis: Secondary | ICD-10-CM | POA: Diagnosis not present

## 2018-09-04 DIAGNOSIS — R635 Abnormal weight gain: Secondary | ICD-10-CM | POA: Diagnosis not present

## 2018-09-17 DIAGNOSIS — G4733 Obstructive sleep apnea (adult) (pediatric): Secondary | ICD-10-CM | POA: Diagnosis not present

## 2018-09-24 DIAGNOSIS — R110 Nausea: Secondary | ICD-10-CM | POA: Diagnosis not present

## 2018-09-24 DIAGNOSIS — R1013 Epigastric pain: Secondary | ICD-10-CM | POA: Diagnosis not present

## 2018-10-03 DIAGNOSIS — G4733 Obstructive sleep apnea (adult) (pediatric): Secondary | ICD-10-CM | POA: Diagnosis not present

## 2018-11-02 DIAGNOSIS — G4733 Obstructive sleep apnea (adult) (pediatric): Secondary | ICD-10-CM | POA: Diagnosis not present

## 2018-12-08 DIAGNOSIS — Z6834 Body mass index (BMI) 34.0-34.9, adult: Secondary | ICD-10-CM

## 2018-12-08 HISTORY — DX: Body mass index (BMI) 34.0-34.9, adult: Z68.34

## 2018-12-17 DIAGNOSIS — E782 Mixed hyperlipidemia: Secondary | ICD-10-CM

## 2018-12-17 HISTORY — DX: Mixed hyperlipidemia: E78.2

## 2018-12-17 HISTORY — DX: Hypercalcemia: E83.52

## 2019-03-02 ENCOUNTER — Ambulatory Visit: Payer: 59 | Admitting: Physician Assistant

## 2019-03-15 ENCOUNTER — Ambulatory Visit: Payer: 59 | Admitting: Physician Assistant

## 2019-04-08 ENCOUNTER — Emergency Department (HOSPITAL_COMMUNITY): Payer: 59 | Attending: Emergency Medicine

## 2019-04-08 ENCOUNTER — Encounter (HOSPITAL_COMMUNITY): Payer: Self-pay

## 2019-04-08 ENCOUNTER — Other Ambulatory Visit: Payer: Self-pay

## 2019-04-08 ENCOUNTER — Emergency Department (HOSPITAL_COMMUNITY)
Admission: EM | Admit: 2019-04-08 | Discharge: 2019-04-09 | Disposition: A | Discharge status: Home / Self Care | Payer: No Typology Code available for payment source | Attending: Emergency Medicine | Admitting: Emergency Medicine

## 2019-04-08 DIAGNOSIS — Y999 Unspecified external cause status: Secondary | ICD-10-CM | POA: Insufficient documentation

## 2019-04-08 DIAGNOSIS — S134XXA Sprain of ligaments of cervical spine, initial encounter: Secondary | ICD-10-CM | POA: Diagnosis not present

## 2019-04-08 DIAGNOSIS — Z79899 Other long term (current) drug therapy: Secondary | ICD-10-CM | POA: Insufficient documentation

## 2019-04-08 DIAGNOSIS — Y9389 Activity, other specified: Secondary | ICD-10-CM | POA: Diagnosis not present

## 2019-04-08 DIAGNOSIS — S0993XA Unspecified injury of face, initial encounter: Secondary | ICD-10-CM | POA: Diagnosis present

## 2019-04-08 DIAGNOSIS — S139XXA Sprain of joints and ligaments of unspecified parts of neck, initial encounter: Secondary | ICD-10-CM

## 2019-04-08 DIAGNOSIS — Y92149 Unspecified place in prison as the place of occurrence of the external cause: Secondary | ICD-10-CM | POA: Insufficient documentation

## 2019-04-08 DIAGNOSIS — M4310 Spondylolisthesis, site unspecified: Secondary | ICD-10-CM

## 2019-04-08 MED ORDER — NAPROXEN 500 MG PO TABS
500.0000 mg | ORAL_TABLET | Freq: Once | ORAL | Status: AC
  Administered 2019-04-08: 500 mg via ORAL
  Filled 2019-04-08: qty 1
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
## 2019-04-09 MED ORDER — METHOCARBAMOL 500 MG PO TABS: 500.0000 mg | ORAL_TABLET | Freq: Two times a day (BID) | ORAL | 0 refills | Status: DC | PRN

## 2019-04-09 MED ORDER — MELOXICAM 7.5 MG PO TABS: 15.0000 mg | ORAL_TABLET | Freq: Every day | ORAL | 0 refills | Status: DC

## 2019-04-09 NOTE — Discharge Instructions (Signed)
Your x-ray showed a very slight shifting of 1 of the bones in your neck.  We recommend follow-up with neurosurgery to ensure improvement in your pain and no worsening to this finding.  Alternate ice and heat to areas of injury 3-4 times per day to limit inflammation and spasm.  Avoid strenuous activity and heavy lifting.  We recommend consistent use of naproxen in addition to Robaxin for muscle spasms.  Do not drive or drink alcohol after taking Robaxin as it may make you drowsy and impair your judgment.  Follow-up with your primary care doctor until you are able to see a neurosurgeon.    Return to the ED for any new or concerning symptoms such as numbness or tingling in your arms or legs, weakness of your arms or legs, or severe worsening of your neck pain.

## 2020-02-16 ENCOUNTER — Institutional Professional Consult (permissible substitution): Payer: 59 | Admitting: Neurology

## 2020-03-17 ENCOUNTER — Encounter: Payer: Self-pay | Admitting: Neurology

## 2020-03-20 ENCOUNTER — Other Ambulatory Visit: Payer: Self-pay

## 2020-03-20 ENCOUNTER — Encounter: Payer: Self-pay | Admitting: Neurology

## 2020-03-20 ENCOUNTER — Ambulatory Visit: Payer: 59 | Admitting: Neurology

## 2020-03-20 VITALS — BP 132/88 | HR 81 | Ht 70.0 in | Wt 207.0 lb

## 2020-03-20 DIAGNOSIS — F5104 Psychophysiologic insomnia: Secondary | ICD-10-CM | POA: Diagnosis not present

## 2020-03-20 DIAGNOSIS — G478 Other sleep disorders: Secondary | ICD-10-CM

## 2020-03-20 DIAGNOSIS — G4731 Primary central sleep apnea: Secondary | ICD-10-CM | POA: Diagnosis not present

## 2020-03-20 DIAGNOSIS — Z9189 Other specified personal risk factors, not elsewhere classified: Secondary | ICD-10-CM | POA: Insufficient documentation

## 2020-03-20 NOTE — Patient Instructions (Signed)
Please remember to try to maintain good sleep hygiene, which means: Keep a regular sleep and wake schedule, try not to exercise or have a meal within 2 hours of your bedtime, try to keep your bedroom conducive for sleep, that is, cool and dark, without light distractors such as an illuminated alarm clock, and refrain from watching TV right before sleep or in the middle of the night and do not keep the TV or radio on during the night. Also, try not to use or play on electronic devices at bedtime, such as your cell phone, tablet PC or laptop. If you like to read at bedtime on an electronic device, try to dim the background light as much as possible. Do not eat in the middle of the night.   We will request a sleep study.    We will look for leg twitching and snoring or sleep apnea.   For chronic insomnia, you are best followed by a psychiatrist and/or sleep psychologist.   We will call you with the sleep study results and make a follow up appointment if needed.     Insomnia Insomnia is a sleep disorder that makes it difficult to fall asleep or stay asleep. Insomnia can cause fatigue, low energy, difficulty concentrating, mood swings, and poor performance at work or school. There are three different ways to classify insomnia:  Difficulty falling asleep.  Difficulty staying asleep.  Waking up too early in the morning. Any type of insomnia can be long-term (chronic) or short-term (acute). Both are common. Short-term insomnia usually lasts for three months or less. Chronic insomnia occurs at least three times a week for longer than three months. What are the causes? Insomnia may be caused by another condition, situation, or substance, such as:  Anxiety.  Certain medicines.  Gastroesophageal reflux disease (GERD) or other gastrointestinal conditions.  Asthma or other breathing conditions.  Restless legs syndrome, sleep apnea, or other sleep disorders.  Chronic  pain.  Menopause.  Stroke.  Abuse of alcohol, tobacco, or illegal drugs.  Mental health conditions, such as depression.  Caffeine.  Neurological disorders, such as Alzheimer's disease.  An overactive thyroid (hyperthyroidism). Sometimes, the cause of insomnia may not be known. What increases the risk? Risk factors for insomnia include:  Gender. Women are affected more often than men.  Age. Insomnia is more common as you get older.  Stress.  Lack of exercise.  Irregular work schedule or working night shifts.  Traveling between different time zones.  Certain medical and mental health conditions. What are the signs or symptoms? If you have insomnia, the main symptom is having trouble falling asleep or having trouble staying asleep. This may lead to other symptoms, such as:  Feeling fatigued or having low energy.  Feeling nervous about going to sleep.  Not feeling rested in the morning.  Having trouble concentrating.  Feeling irritable, anxious, or depressed. How is this diagnosed? This condition may be diagnosed based on:  Your symptoms and medical history. Your health care provider may ask about: ? Your sleep habits. ? Any medical conditions you have. ? Your mental health.  A physical exam. How is this treated? Treatment for insomnia depends on the cause. Treatment may focus on treating an underlying condition that is causing insomnia. Treatment may also include:  Medicines to help you sleep.  Counseling or therapy.  Lifestyle adjustments to help you sleep better. Follow these instructions at home: Eating and drinking   Limit or avoid alcohol, caffeinated beverages, and cigarettes,   especially close to bedtime. These can disrupt your sleep.  Do not eat a large meal or eat spicy foods right before bedtime. This can lead to digestive discomfort that can make it hard for you to sleep. Sleep habits   Keep a sleep diary to help you and your health care  provider figure out what could be causing your insomnia. Write down: ? When you sleep. ? When you wake up during the night. ? How well you sleep. ? How rested you feel the next day. ? Any side effects of medicines you are taking. ? What you eat and drink.  Make your bedroom a dark, comfortable place where it is easy to fall asleep. ? Put up shades or blackout curtains to block light from outside. ? Use a white noise machine to block noise. ? Keep the temperature cool.  Limit screen use before bedtime. This includes: ? Watching TV. ? Using your smartphone, tablet, or computer.  Stick to a routine that includes going to bed and waking up at the same times every day and night. This can help you fall asleep faster. Consider making a quiet activity, such as reading, part of your nighttime routine.  Try to avoid taking naps during the day so that you sleep better at night.  Get out of bed if you are still awake after 15 minutes of trying to sleep. Keep the lights down, but try reading or doing a quiet activity. When you feel sleepy, go back to bed. General instructions  Take over-the-counter and prescription medicines only as told by your health care provider.  Exercise regularly, as told by your health care provider. Avoid exercise starting several hours before bedtime.  Use relaxation techniques to manage stress. Ask your health care provider to suggest some techniques that may work well for you. These may include: ? Breathing exercises. ? Routines to release muscle tension. ? Visualizing peaceful scenes.  Make sure that you drive carefully. Avoid driving if you feel very sleepy.  Keep all follow-up visits as told by your health care provider. This is important. Contact a health care provider if:  You are tired throughout the day.  You have trouble in your daily routine due to sleepiness.  You continue to have sleep problems, or your sleep problems get worse. Get help right  away if:  You have serious thoughts about hurting yourself or someone else. If you ever feel like you may hurt yourself or others, or have thoughts about taking your own life, get help right away. You can go to your nearest emergency department or call:  Your local emergency services (911 in the [place]  A suicide crisis helpline, such as the National Suicide Prevention Lifeline at 1-800-273-8255. This is open 24 hours a day. Summary  Insomnia is a sleep disorder that makes it difficult to fall asleep or stay asleep.  Insomnia can be long-term (chronic) or short-term (acute).  Treatment for insomnia depends on the cause. Treatment may focus on treating an underlying condition that is causing insomnia.  Keep a sleep diary to help you and your health care provider figure out what could be causing your insomnia. This information is not intended to replace advice given to you by your health care provider. Make sure you discuss any questions you have with your health care provider. Document Revised: 05/30/2017 Document Reviewed: 03/27/2017 [employer] Patient Education  2020 [employer]  

## 2020-03-20 NOTE — Progress Notes (Signed)
SLEEP MEDICINE CLINIC    Provider:  [name], MD  Primary Care Physician:  Deland Pretty, [place] [address]     Referring Provider: Deland Pretty, [place],  [place] [address]          Chief Complaint according to patient   Patient presents with:    . New Patient (Initial Visit)           HISTORY OF PRESENT ILLNESS:  Erik Walker is a 34 - year- old  Caucasian male patient and seen hpon consultation requested by Dr Shelia Media,  on 03/20/2020.  Chief concern according to patient : Hard time going to sleep and staying asleep.    I have the pleasure of seeing Erik Walker today, a right-handed Caucasian male with an insomnia disorder, arising over the last 3-4 month. He   has a past medical history of Androgen deficiency (08/16/2013), Body mass index 34.0-34.9, adult (12/08/2018), Depression with anxiety (06/25/2017), Esophageal reflux (08/25/2018), GERD (gastroesophageal reflux disease), Hypercalcemia (12/17/2018), Liver enzyme elevation, Major depressive disorder (06/25/2017), Mixed hyperlipidemia (12/17/2018), Nausea (08/25/2018), OSA on CPAP, and Primary insomnia (04/28/2018).   Cyclic insomnia, related to depression and stress.    The patient had the first sleep study in 11-2017-this was a home sleep test performed on 10 December 2017 through virtual ox home sleep testing.  BMI was 31.48, RDI was 7.5/h and AHI is not used in this particular report.  Oxygen nadir was 90% saturation which is fine and there was no oxygen saturation below.  And minimum maximum heart rate varied between 41 bpm and 122 bpm there was really at best a mild sleep apnea noted, there was no associated cardiac irregularity or hypoxemia.  It seems that the apneas were central apneas 15 out of 16 total apneas. This constellation usually would call for an attended sleep study to titrate to positive airway pressure.  The patient has had more  anxiety when using CPAP and became non-compliant-  It's in his car, not brought to the office.   Sleep relevant medical history: Anxiety related insomnia- recent sleep test was a HST- Tonsillectomyin childhood, cervical spine injury.     Family medical /sleep history: no  other family member on CPAP with OSA, insomnia, and no sleep walkers.    Social history:  Patient is working as a Corporate treasurer- daytime only-  and lives in a household with his father and his dog. Family status is single.   Tobacco use; never .  ETOH use ; no, Caffeine intake in form of  Soda( 1-2/day) .Hobbies : Gym.      Sleep habits are as follows: The patient's dinner time is between 5.30 PM. The patient goes to bed at 11 PM and falls asleep by 11.30 with medication- he continues to sleep in intervals of 1-2 hours , wakes for unknown reason-  And goes back to sleep- 6 hours of total sleep-   The preferred sleep position is on his side, with the support of 2 pillows.  Dreams are reportedly  Frequent/vivid/ no not nightmarish.   7 AM is the usual rise time. The patient wakes up at 6.15 with an alarm. He reports not feeling refreshed or restored in AM, with symptoms such as dry mouth , morning headaches, and residual fatigue.  Naps are taken on weekends frequently, lasting from  45-60 minutes and are less refreshing than nocturnal sleep.  Review of Systems: Out of a complete 14 system review, the patient complains of only the following symptoms, and all other reviewed systems are negative.:  Fatigue, sleepiness , snoring reported by father , fragmented sleep, Insomnia - reported as cyclic.  Sleep paralysis, no dream intrusion, no hallucinations. No cataplexy.  No irrestible urge to sleep or move.    How likely are you to doze in the following situations: 0 = not likely, 1 = slight chance, 2 = moderate chance, 3 = high chance   Sitting and Reading? Watching Television? Sitting inactive in a public place (theater  or meeting)? As a passenger in a car for an hour without a break? Lying down in the afternoon when circumstances permit? Sitting and talking to someone? Sitting quietly after lunch without alcohol? In a car, while stopped for a few minutes in traffic?   Total = 6/ 24 points   FSS endorsed at 61/ 63 points.   Social History   Socioeconomic History  . Marital status: Single    Spouse name: Not on file  . Number of children: Not on file  . Years of education: Not on file  . Highest education level: Not on file  Occupational History  . Not on file  Tobacco Use  . Smoking status: Never Smoker  . Smokeless tobacco: Never Used  Vaping Use  . Vaping Use: Never used  Substance and Sexual Activity  . Alcohol use: No  . Drug use: No  . Sexual activity: Not on file  Other Topics Concern  . Not on file  Social History Narrative  . Not on file   Social Determinants of Health   Financial Resource Strain:   . Difficulty of Paying Living Expenses: Not on file  Food Insecurity:   . Worried About Charity fundraiser in the Last Year: Not on file  . Ran Out of Food in the Last Year: Not on file  Transportation Needs:   . Lack of Transportation (Medical): Not on file  . Lack of Transportation (Non-Medical): Not on file  Physical Activity:   . Days of Exercise per Week: Not on file  . Minutes of Exercise per Session: Not on file  Stress:   . Feeling of Stress : Not on file  Social Connections:   . Frequency of Communication with Friends and Family: Not on file  . Frequency of Social Gatherings with Friends and Family: Not on file  . Attends Religious Services: Not on file  . Active Member of Clubs or Organizations: Not on file  . Attends Archivist Meetings: Not on file  . Marital Status: Not on file    No family history on file.  Past Medical History:  Diagnosis Date  . Androgen deficiency 08/16/2013   from medical record Dr Shelia Media  . Body mass index 34.0-34.9,  adult 12/08/2018   Dr Shelia Media  . Depression with anxiety 06/25/2017   Dr Shelia Media  . Esophageal reflux 08/25/2018   Dr Shelia Media  . GERD (gastroesophageal reflux disease)   . Hypercalcemia 12/17/2018   Dr Shelia Media  . Liver enzyme elevation   . Major depressive disorder 06/25/2017   Dr Shelia Media  . Mixed hyperlipidemia 12/17/2018   Dr Shelia Media  . Nausea 08/25/2018   Dr Shelia Media  . OSA on CPAP   . Primary insomnia 04/28/2018   Dr Shelia Media    Past Surgical History:  Procedure Laterality Date  . ROTATOR CUFF REPAIR Right 2013   Dr Tanda Rockers  .  SHOULDER SURGERY     RIGHT x2  . wrist ligament repair Left      Current Outpatient Medications on File Prior to Visit  Medication Sig Dispense Refill  . atorvastatin (LIPITOR) 20 MG tablet Take 20 mg by mouth daily.    . clonazePAM (KLONOPIN) 0.5 MG tablet Take 1 mg by mouth 2 (two) times daily as needed for anxiety.     . mirtazapine (REMERON) 30 MG tablet Take 30 mg by mouth at bedtime.     Marland Kitchen venlafaxine XR (EFFEXOR-XR) 150 MG 24 hr capsule Take 300 mg by mouth daily.      No current facility-administered medications on file prior to visit.    Allergies  Allergen Reactions  . Zoloft [Sertraline Hcl] Other (See Comments)  . Wellbutrin [Bupropion] Rash    Physical exam:  Today's Vitals   03/20/20 1543  BP: 132/88  Pulse: 81  Weight: 207 lb (93.9 kg)  Height: 5\' 10"  (1.778 m)   Body mass index is 29.7 kg/m.   Wt Readings from Last 3 Encounters:  03/20/20 207 lb (93.9 kg)  04/08/19 206 lb (93.4 kg)  08/06/18 208 lb 6.4 oz (94.5 kg)     Ht Readings from Last 3 Encounters:  03/20/20 5\' 10"  (1.778 m)  04/08/19 5\' 10"  (1.778 m)  08/06/18 5\' 10"  (1.778 m)      General: The patient is awake, alert and appears not in acute distress. The patient is well groomed. Head: Normocephalic, atraumatic. Neck is supple.  Mallampati 2 plus, narrow and low. Crowded dentition. Full facial hair. ,  neck circumference:17.5  inches . Nasal airflow patent.   Retrognathia is not seen.  Prominent TMJ .   Cardiovascular:  Regular rate and cardiac rhythm by pulse,  without distended neck veins. Respiratory: Lungs are clear to auscultation.  Skin:  Without evidence of ankle edema, or rash. Trunk: The patient's posture is stooped.    Neurologic exam : The patient is awake and alert, oriented to place and time.   Memory subjective described as intact.  Attention span & concentration ability appears normal.  Speech is fluent,  without  dysarthria, dysphonia or aphasia.  Mood and affect are appropriate.   Cranial nerves: no loss of smell or taste reported  Pupils are equal and briskly reactive to light. Funduscopic exam deferred. .  Extraocular movements in vertical and horizontal planes were intact and without nystagmus. No Diplopia. Visual fields by finger perimetry are intact. Hearing was intact to soft voice and finger rubbing.    Facial sensation intact to fine touch.  Facial motor strength is symmetric and tongue and uvula move midline.  Neck ROM : rotation, tilt and flexion extension were normal for age and shoulder shrug was symmetrical.    Motor exam:  Symmetric bulk, tone and ROM.   Normal tone without cog wheeling, symmetric grip strength .   Sensory:  Fine touch, pinprick and vibration were tested  and  normal.  Proprioception tested in the upper extremities was normal.   Coordination: Rapid alternating movements in the fingers/hands were of normal speed.  The Finger-to-nose maneuver was intact without evidence of ataxia, dysmetria or tremor.   Gait and station: Patient could rise unassisted from a seated position, walked without assistive device.  Stance is of normal width/ base and the patient turned with 3 steps.  Toe and heel walk were deferred.  Deep tendon reflexes: in the  upper and lower extremities are symmetric and intact.  Babinski response  was deferred .      After spending a total time of 35   minutes face to face  and additional time for physical and neurologic examination, review of laboratory studies,  personal review of imaging studies, reports and results of other testing and review of referral information / records as far as provided in visit, I have established the following assessments:  1) upon review of the patient's home sleep test from 2 years ago I decided that we need to repeat a study.  Mr. Van has lost weight since he underwent a home sleep test, he currently sleeps about 6 hours but he does not feel that his sleep has any restorative refreshing quality.  May be a dry mouth and sometimes he wakes up with headaches.  He has not used his CPAP as it increases anxiety and hindered him to sleep even though 6 hours he craves.  We discussed today that he would benefit from going to bed a little earlier so may be began at this way 30 or 45 minutes of sleep time.  He may try melatonin to sleep longer or more uninterrupted.  I recommend less than 5 mg at night and it may be best to start with a 1 mg dose capsule, tablet or even drops and increase to see where the most affected can be gained.   A repeat of the home sleep test would definitely be necessary as it indicated some central sleep apnea.  If his insurance allows me an attended sleep study I would much prefer this kind of testing to see what leads to arousals since the patient cannot identify the trigger.  Insomnia- chronic _   He has tried trazodone in the past which is a nonaddictive medication.  I reviewed his laboratory results at 1 time the seems to have been a fluke result of an elevated creatinine that was not confirmed in repeat studies.  I tried to find a list of his current medications it seems that he is on Pepcid, atorvastatin, but he was put on clona zepam 1 mg which has a very high habit-forming risk.  Venlafaxine also known as Effexor extended release 24 hours 150 mg he takes 2 tablets together once a day and mirtazapine 15 mg half tablet at  bedtime this is an anxiolytic that also helps with sleep induction.  So he seems to be on a medication that allows at least for a latency of 30 minutes.  Still even with this medication now he feels that he still has a need for an insomnia work-up.  He is well aware that most insomnia is and not organic in origin.  Dr. Concha Pyo has been also his depression physician.  I think with chronic insomnia he may be better served with a behavioral therapist however I want to first rule out any organic sleep disorder.  Since he has not used his CPAP in a very long time I see no reason to ask him resuming it until I know what his current baseline diagnosis.     My Plan is to proceed with:  1) attended PSG study preferred for central apneas being indicated in his last HST 15 out of 16 apneas were central.  2) continue dr Pennie Banter prescriptions. \  3) insomnia behavior changes needed.   I would like to thank Deland Pretty, MD and Deland Pretty, [address] [place] [place],  [place] [address] for allowing me to meet with this pleasant patient in the setting  of a consultations visit.  We will request a sleep study.    We will look for leg twitching and snoring or sleep apnea.   For chronic insomnia, you are best followed by a psychiatrist and/or sleep psychologist.   We will call you with the sleep study results and make a follow up appointment if needed.     CC: I will share my notes with PCP.   Electronically signed by: [name], MD 03/20/2020 3:52 PM  [facility] Board certified by The [employer] of Sleep Medicine and Diplomate of the [employer] of Sleep Medicine. Board certified In Neurology through the [place], Fellow of the [employer] of Neurology. Medical Director of [facility].

## 2020-06-19 ENCOUNTER — Other Ambulatory Visit: Payer: Self-pay | Admitting: Gastroenterology

## 2020-06-19 ENCOUNTER — Other Ambulatory Visit (HOSPITAL_COMMUNITY): Payer: Self-pay | Admitting: Gastroenterology

## 2020-06-19 DIAGNOSIS — R1011 Right upper quadrant pain: Secondary | ICD-10-CM

## 2020-07-04 DIAGNOSIS — K3189 Other diseases of stomach and duodenum: Secondary | ICD-10-CM | POA: Diagnosis not present

## 2020-07-04 DIAGNOSIS — K259 Gastric ulcer, unspecified as acute or chronic, without hemorrhage or perforation: Secondary | ICD-10-CM | POA: Diagnosis not present

## 2020-07-04 DIAGNOSIS — R1013 Epigastric pain: Secondary | ICD-10-CM | POA: Diagnosis not present

## 2020-07-04 DIAGNOSIS — R1033 Periumbilical pain: Secondary | ICD-10-CM | POA: Diagnosis not present

## 2020-07-18 ENCOUNTER — Ambulatory Visit (HOSPITAL_COMMUNITY): Payer: Managed Care, Other (non HMO)

## 2020-07-18 ENCOUNTER — Encounter (HOSPITAL_COMMUNITY): Payer: Self-pay

## 2020-07-27 DIAGNOSIS — J069 Acute upper respiratory infection, unspecified: Secondary | ICD-10-CM | POA: Diagnosis not present

## 2020-08-03 ENCOUNTER — Ambulatory Visit (HOSPITAL_COMMUNITY): Payer: Managed Care, Other (non HMO)

## 2020-08-03 ENCOUNTER — Other Ambulatory Visit: Payer: Self-pay

## 2020-08-03 ENCOUNTER — Ambulatory Visit (HOSPITAL_COMMUNITY)
Admission: RE | Admit: 2020-08-03 | Discharge: 2020-08-03 | Disposition: A | Discharge status: Home / Self Care | Payer: Managed Care, Other (non HMO) | Source: Ambulatory Visit | Attending: Gastroenterology | Admitting: Gastroenterology

## 2020-08-03 DIAGNOSIS — R630 Anorexia: Secondary | ICD-10-CM | POA: Diagnosis not present

## 2020-08-03 DIAGNOSIS — R1011 Right upper quadrant pain: Secondary | ICD-10-CM | POA: Diagnosis not present

## 2020-08-03 MED ORDER — TECHNETIUM TC 99M MEBROFENIN IV KIT
4.9000 | PACK | Freq: Once | INTRAVENOUS | Status: AC
  Administered 2020-08-03: 4.9 via INTRAVENOUS

## 2020-08-29 DIAGNOSIS — R1013 Epigastric pain: Secondary | ICD-10-CM | POA: Diagnosis not present

## 2020-09-04 ENCOUNTER — Other Ambulatory Visit: Payer: Self-pay | Admitting: General Surgery

## 2020-09-04 DIAGNOSIS — R1013 Epigastric pain: Secondary | ICD-10-CM

## 2020-09-06 ENCOUNTER — Other Ambulatory Visit: Payer: Self-pay

## 2020-09-06 ENCOUNTER — Ambulatory Visit
Admission: RE | Admit: 2020-09-06 | Discharge: 2020-09-06 | Disposition: A | Discharge status: Home / Self Care | Payer: Managed Care, Other (non HMO) | Source: Ambulatory Visit | Attending: General Surgery | Admitting: General Surgery

## 2020-09-06 DIAGNOSIS — R109 Unspecified abdominal pain: Secondary | ICD-10-CM | POA: Diagnosis not present

## 2020-09-06 DIAGNOSIS — K7689 Other specified diseases of liver: Secondary | ICD-10-CM | POA: Diagnosis not present

## 2020-09-06 DIAGNOSIS — R197 Diarrhea, unspecified: Secondary | ICD-10-CM | POA: Diagnosis not present

## 2020-09-06 DIAGNOSIS — R7989 Other specified abnormal findings of blood chemistry: Secondary | ICD-10-CM | POA: Diagnosis not present

## 2020-09-06 DIAGNOSIS — R1013 Epigastric pain: Secondary | ICD-10-CM

## 2020-09-06 MED ORDER — IOPAMIDOL (ISOVUE-300) INJECTION 61%
100.0000 mL | Freq: Once | INTRAVENOUS | Status: AC | PRN
  Administered 2020-09-06: 100 mL via INTRAVENOUS

## 2020-09-19 DIAGNOSIS — S39012A Strain of muscle, fascia and tendon of lower back, initial encounter: Secondary | ICD-10-CM | POA: Diagnosis not present

## 2020-09-29 DIAGNOSIS — R5383 Other fatigue: Secondary | ICD-10-CM | POA: Diagnosis not present

## 2020-10-02 DIAGNOSIS — Z713 Dietary counseling and surveillance: Secondary | ICD-10-CM | POA: Diagnosis not present

## 2020-10-02 DIAGNOSIS — R7989 Other specified abnormal findings of blood chemistry: Secondary | ICD-10-CM | POA: Diagnosis not present

## 2020-10-02 DIAGNOSIS — R5383 Other fatigue: Secondary | ICD-10-CM | POA: Diagnosis not present

## 2020-10-02 DIAGNOSIS — Z6834 Body mass index (BMI) 34.0-34.9, adult: Secondary | ICD-10-CM | POA: Diagnosis not present

## 2020-10-02 DIAGNOSIS — E291 Testicular hypofunction: Secondary | ICD-10-CM | POA: Diagnosis not present

## 2020-10-02 DIAGNOSIS — R69 Illness, unspecified: Secondary | ICD-10-CM | POA: Diagnosis not present

## 2020-10-10 DIAGNOSIS — F418 Other specified anxiety disorders: Secondary | ICD-10-CM | POA: Diagnosis not present

## 2020-10-10 DIAGNOSIS — G4733 Obstructive sleep apnea (adult) (pediatric): Secondary | ICD-10-CM | POA: Diagnosis not present

## 2020-10-10 DIAGNOSIS — R635 Abnormal weight gain: Secondary | ICD-10-CM | POA: Diagnosis not present

## 2020-10-10 DIAGNOSIS — R69 Illness, unspecified: Secondary | ICD-10-CM | POA: Diagnosis not present

## 2020-10-18 ENCOUNTER — Telehealth: Payer: Self-pay

## 2020-10-18 DIAGNOSIS — G4733 Obstructive sleep apnea (adult) (pediatric): Secondary | ICD-10-CM | POA: Diagnosis not present

## 2020-10-18 NOTE — Telephone Encounter (Signed)
Another referral was sent over from Dr. Raul Del office. Pt did not complete sleep study that was scheduled in Nov 2021. Pt is interested in scheduling another sleep study. We need to get authorization from insurance before we can re-schedule. I will be calling pt back soon to reschedule the inlab sleep study.

## 2020-10-19 ENCOUNTER — Telehealth: Payer: Self-pay

## 2020-10-19 NOTE — Telephone Encounter (Signed)
LVM for pt to call me back to schedule sleep study  

## 2020-10-30 DIAGNOSIS — M25562 Pain in left knee: Secondary | ICD-10-CM | POA: Diagnosis not present

## 2020-11-08 DIAGNOSIS — K219 Gastro-esophageal reflux disease without esophagitis: Secondary | ICD-10-CM | POA: Diagnosis not present

## 2020-11-08 DIAGNOSIS — R140 Abdominal distension (gaseous): Secondary | ICD-10-CM | POA: Diagnosis not present

## 2020-11-12 DIAGNOSIS — J029 Acute pharyngitis, unspecified: Secondary | ICD-10-CM | POA: Diagnosis not present

## 2020-11-13 DIAGNOSIS — E291 Testicular hypofunction: Secondary | ICD-10-CM | POA: Diagnosis not present

## 2020-11-13 DIAGNOSIS — R7989 Other specified abnormal findings of blood chemistry: Secondary | ICD-10-CM | POA: Diagnosis not present

## 2020-11-13 DIAGNOSIS — Z6834 Body mass index (BMI) 34.0-34.9, adult: Secondary | ICD-10-CM | POA: Diagnosis not present

## 2020-11-13 DIAGNOSIS — R748 Abnormal levels of other serum enzymes: Secondary | ICD-10-CM | POA: Diagnosis not present

## 2020-11-16 ENCOUNTER — Ambulatory Visit (INDEPENDENT_AMBULATORY_CARE_PROVIDER_SITE_OTHER): Payer: 59 | Admitting: Neurology

## 2020-11-16 ENCOUNTER — Other Ambulatory Visit: Payer: Self-pay

## 2020-11-16 DIAGNOSIS — E291 Testicular hypofunction: Secondary | ICD-10-CM | POA: Diagnosis not present

## 2020-11-16 DIAGNOSIS — Z9189 Other specified personal risk factors, not elsewhere classified: Secondary | ICD-10-CM

## 2020-11-16 DIAGNOSIS — R69 Illness, unspecified: Secondary | ICD-10-CM | POA: Diagnosis not present

## 2020-11-16 DIAGNOSIS — F5104 Psychophysiologic insomnia: Secondary | ICD-10-CM

## 2020-11-16 DIAGNOSIS — G4731 Primary central sleep apnea: Secondary | ICD-10-CM | POA: Diagnosis not present

## 2020-11-16 DIAGNOSIS — Z713 Dietary counseling and surveillance: Secondary | ICD-10-CM | POA: Diagnosis not present

## 2020-11-16 DIAGNOSIS — Z6834 Body mass index (BMI) 34.0-34.9, adult: Secondary | ICD-10-CM | POA: Diagnosis not present

## 2020-11-16 DIAGNOSIS — R5383 Other fatigue: Secondary | ICD-10-CM | POA: Diagnosis not present

## 2020-11-16 DIAGNOSIS — G478 Other sleep disorders: Secondary | ICD-10-CM

## 2020-11-16 DIAGNOSIS — R7989 Other specified abnormal findings of blood chemistry: Secondary | ICD-10-CM | POA: Diagnosis not present

## 2020-11-17 DIAGNOSIS — G4733 Obstructive sleep apnea (adult) (pediatric): Secondary | ICD-10-CM | POA: Diagnosis not present

## 2020-11-29 NOTE — Procedures (Signed)
PATIENT'S NAME:  Erik, Walker DOB:      Nov 23, 1985      MR#:    [id number]     DATE OF RECORDING: 11/16/2020 REFERRING M.D.:  Deland Pretty, MD Study Performed:   Baseline Polysomnogram HISTORY:  Erik Walker presented on 03-20-2020 with an insomnia disorder, arising over the previous 3-4 month. He has a medical history of Androgen deficiency (08/16/2013), Body mass index 34.0-34.9, adult (12/08/2018), Depression with anxiety (06/25/2017), Esophageal reflux (08/25/2018), GERD (gastroesophageal reflux disease), Hypercalcemia (12/17/2018), Liver enzyme elevation, Major depressive disorder (06/25/2017), Mixed hyperlipidemia (12/17/2018), Nausea (08/25/2018), untreated OSA(?), and (25/36/6440)/ Cyclic insomnia, related to depression and stress.    The patient had the first sleep study in 11-2017- this was a home sleep test performed on 10 December 2017 through virtual ox home sleep testing.   BMI was 31.48, RDI was 7.5/h and AHI is not used in this particular report.  Oxygen nadir was 90% saturation which is fine and heart rate varied between 41 bpm and 122 bpm. there was really at best a mild sleep apnea noted, there was no associated cardiac irregularity or hypoxemia.   It seems that the apneas were central apneas in 15 out of 16 total apneas. This constellation usually would call for an attended sleep study to titrate to positive airway pressure.  The patient has had more anxiety when using CPAP and became non-compliant. Sleep relevant medical history: Anxiety related insomnia- recent sleep test was a HST- Tonsillectomy in childhood, cervical spine injury.   The patient endorsed the Epworth Sleepiness Scale at 6 points.   The patient's weight 207 pounds with a height of 70 (inches), resulting in a BMI of 29.7 kg/m2. The patient's neck circumference measured 17 inches.  CURRENT MEDICATIONS: Lipitor, Klonopin, Remeron, Effexor-XR   PROCEDURE:  This is a multichannel digital polysomnogram utilizing the  Somnostar 11.2 system.  Electrodes and sensors were applied and monitored per AASM Specifications.   EEG, EOG, Chin and Limb EMG, were sampled at 200 Hz.  ECG, Snore and Nasal Pressure, Thermal Airflow, Respiratory Effort, CPAP Flow and Pressure, Oximetry was sampled at 50 Hz. Digital video and audio were recorded.      BASELINE STUDY: Lights Out was at 21:58 and Lights On at 05:22.  Total recording time (TRT) was 444.5 minutes, with a total sleep time (TST) of 336.5 minutes.   The patient's sleep latency was 90.5 minutes.  REM latency was 0 minutes.  The sleep efficiency was 75.7 %.     SLEEP ARCHITECTURE: WASO (Wake after sleep onset) was 4 minutes.  There were 25.5 minutes in Stage N1, 288.5 minutes Stage N2, 22.5 minutes Stage N3 and 0 minutes in Stage REM.  The percentage of Stage N1 was 7.6%, Stage N2 was 85.7%, Stage N3 was 6.7% and Stage R (REM sleep) was 0%.    RESPIRATORY ANALYSIS:  There were a total of 281 respiratory events:  0 obstructive apneas, 215 central apneas and 0 mixed apneas with a total of 215 apneas and an apnea index (AI) of 38.3 /hour. There were 66 hypopneas with a hypopnea index of 11.8 /hour. The patient also had 0 respiratory event related arousals (RERAs).      The total APNEA/HYPOPNEA INDEX (AHI) was 50.1/hour.  There was no  REM sleep and 347 events in NREM. Apneas clustered in right sided sleep -  The patient spent 63.5 minutes of total sleep time in the supine position and 273 minutes in non-supine.  The supine  AHI was 19.8/h versus a non-supine AHI of 57.2/h.  OXYGEN SATURATION & C02:  The Wake baseline 02 saturation was 92%, with the lowest being 85%. Time spent below 89% saturation equaled 15 minutes.  The arousals were noted as: 19 were spontaneous, 0 were associated with PLMs, 8 were associated with respiratory events. The patient had a total of 0 Periodic Limb Movements. Audio and video analysis did not show any abnormal or unusual movements, behaviors,  phonations or vocalizations.   EKG was irregular.   IMPRESSION:  1. Severe Central Sleep Apnea with an AHI of 50.1/h. 2. A positional component was present as right sided sleep was associated with highest AHI. 3. Hypoxemia for a total time of 15 minutes and nadir of 85%. Also clustered in right sided sleep   4. Non-specific abnormal EKG  RECOMMENDATIONS:  1. Advise full night, attended, PAP titration study to optimize therapy and possibly change to ASV.     I certify that I have reviewed the entire raw data recording prior to the issuance of this report in accordance with the Standards of Accreditation of the American Academy of Sleep Medicine (AASM)    [name], MD Diplomat, American Board of Psychiatry and Neurology  Diplomat, American Board of Sleep Medicine Market researcher, [place] [facility] [employer]

## 2020-11-29 NOTE — Addendum Note (Signed)
Addended by: [name] on: 11/29/2020 06:02 PM   Modules accepted: Orders

## 2020-11-29 NOTE — Progress Notes (Signed)
IMPRESSION:  1. Severe Central Sleep Apnea with an AHI of 50.1/h. 2. A positional component was present as right sided sleep was associated with highest AHI. 3. Hypoxemia for a total time of 15 minutes and nadir of 85%. Also clustered in right sided sleep   4. Non-specific abnormal EKG  RECOMMENDATIONS:  1. Advise full night, attended, PAP titration study to optimize therapy and possibly change to ASV.

## 2020-11-30 ENCOUNTER — Telehealth: Payer: Self-pay | Admitting: Neurology

## 2020-11-30 DIAGNOSIS — M545 Low back pain, unspecified: Secondary | ICD-10-CM | POA: Diagnosis not present

## 2020-11-30 NOTE — Telephone Encounter (Signed)
-----   Message from [name], MD sent at 11/29/2020  6:02 PM EDT ----- IMPRESSION:  1. Severe Central Sleep Apnea with an AHI of 50.1/h. 2. A positional component was present as right sided sleep was associated with highest AHI. 3. Hypoxemia for a total time of 15 minutes and nadir of 85%. Also clustered in right sided sleep   4. Non-specific abnormal EKG  RECOMMENDATIONS:  1. Advise full night, attended, PAP titration study to optimize therapy and possibly change to ASV.

## 2020-11-30 NOTE — Telephone Encounter (Signed)
I called pt. I advised pt that Dr. Brett Fairy reviewed their sleep study results and found that has severe sleep apnea, mostly central sleep apnea and recommends that pt be treated with a cpap. Dr. Brett Fairy recommends that pt return for a repeat sleep study in order to properly titrate the cpap and ensure a good mask fit. Pt is agreeable to returning for a titration study. I advised pt that our sleep lab will file with pt's insurance and call pt to schedule the sleep study when we hear back from the pt's insurance regarding coverage of this sleep study. Pt verbalized understanding of results. Pt had no questions at this time but was encouraged to call back if questions arise.

## 2020-12-05 DIAGNOSIS — R5383 Other fatigue: Secondary | ICD-10-CM | POA: Diagnosis not present

## 2020-12-05 DIAGNOSIS — R69 Illness, unspecified: Secondary | ICD-10-CM | POA: Diagnosis not present

## 2020-12-05 DIAGNOSIS — Z6834 Body mass index (BMI) 34.0-34.9, adult: Secondary | ICD-10-CM | POA: Diagnosis not present

## 2020-12-05 DIAGNOSIS — R7989 Other specified abnormal findings of blood chemistry: Secondary | ICD-10-CM | POA: Diagnosis not present

## 2020-12-05 DIAGNOSIS — E291 Testicular hypofunction: Secondary | ICD-10-CM | POA: Diagnosis not present

## 2020-12-05 DIAGNOSIS — Z713 Dietary counseling and surveillance: Secondary | ICD-10-CM | POA: Diagnosis not present

## 2020-12-07 DIAGNOSIS — M545 Low back pain, unspecified: Secondary | ICD-10-CM | POA: Diagnosis not present

## 2020-12-18 DIAGNOSIS — G4733 Obstructive sleep apnea (adult) (pediatric): Secondary | ICD-10-CM | POA: Diagnosis not present

## 2021-01-10 DIAGNOSIS — G4733 Obstructive sleep apnea (adult) (pediatric): Secondary | ICD-10-CM | POA: Diagnosis not present

## 2021-01-16 ENCOUNTER — Telehealth: Payer: Self-pay | Admitting: Neurology

## 2021-01-16 DIAGNOSIS — F5104 Psychophysiologic insomnia: Secondary | ICD-10-CM

## 2021-01-16 NOTE — Telephone Encounter (Signed)
Pt called, mirtazapine (REMERON) 30 MG tablet is not working. Could you prescribe something else? Have scheduled an appt with Amy for tomorrow at  8 am. Would like a call from the nurse.

## 2021-01-16 NOTE — Telephone Encounter (Signed)
Since the patient is coming in for the appointment tomorrow to discuss with Amy so she can further evaluate.

## 2021-01-17 ENCOUNTER — Ambulatory Visit: Payer: 59 | Admitting: Family Medicine

## 2021-01-17 DIAGNOSIS — G4733 Obstructive sleep apnea (adult) (pediatric): Secondary | ICD-10-CM | POA: Diagnosis not present

## 2021-01-17 NOTE — Progress Notes (Deleted)
No chief complaint on file.    HISTORY OF PRESENT ILLNESS: 01/17/21 ALL:  Erik Walker is a 35 y.o. male here today for follow up for insomnia and recently diagnosed central sleep apnea. Nocturnal polysomnography showed severe OSA with AHI of 50.1/hr. He was advised to return for CPAP titration which is currently scheduled for 8/11.    HISTORY (copied from Dr Dohmeier's previous note)  Erik Walker is a 60 - year- old  Caucasian male patient and seen hpon consultation requested by Dr Shelia Media,  on 03/20/2020.  Chief concern according to patient : Hard time going to sleep and staying asleep.    I have the pleasure of seeing Erik Walker today, a right-handed Caucasian male with an insomnia disorder, arising over the last 3-4 month. He   has a past medical history of Androgen deficiency (08/16/2013), Body mass index 34.0-34.9, adult (12/08/2018), Depression with anxiety (06/25/2017), Esophageal reflux (08/25/2018), GERD (gastroesophageal reflux disease), Hypercalcemia (12/17/2018), Liver enzyme elevation, Major depressive disorder (06/25/2017), Mixed hyperlipidemia (12/17/2018), Nausea (08/25/2018), OSA on CPAP, and Primary insomnia (04/28/2018).    Cyclic insomnia, related to depression and stress.    The patient had the first sleep study in 11-2017-this was a home sleep test performed on 10 December 2017 through virtual ox home sleep testing.  BMI was 31.48, RDI was 7.5/h and AHI is not used in this particular report.  Oxygen nadir was 90% saturation which is fine and there was no oxygen saturation below.  And minimum maximum heart rate varied between 41 bpm and 122 bpm there was really at best a mild sleep apnea noted, there was no associated cardiac irregularity or hypoxemia.  It seems that the apneas were central apneas 15 out of 16 total apneas.  This constellation usually would call for an attended sleep study to titrate to positive airway pressure.  The patient has had more anxiety when  using CPAP and became non-compliant-  It's in his car, not brought to the office.   Sleep relevant medical history: Anxiety related insomnia- recent sleep test was a HST- Tonsillectomyin childhood, cervical spine injury.     Family medical /sleep history: no  other family member on CPAP with OSA, insomnia, and no sleep walkers.    Social history:  Patient is working as a Corporate treasurer- daytime only-  and lives in a household with his father and his dog. Family status is single.   Tobacco use; never .  ETOH use ; no, Caffeine intake in form of  Soda( 1-2/day) .Hobbies : Gym.  Sleep habits are as follows: The patient's dinner time is between 5.30 PM. The patient goes to bed at 11 PM and falls asleep by 11.30 with medication- he continues to sleep in intervals of 1-2 hours , wakes for unknown reason-  And goes back to sleep- 6 hours of total sleep-   The preferred sleep position is on his side, with the support of 2 pillows. Dreams are reportedly  Frequent/vivid/ no not nightmarish.   7 AM is the usual rise time. The patient wakes up at 6.15 with an alarm. He reports not feeling refreshed or restored in AM, with symptoms such as dry mouth , morning headaches, and residual fatigue. Naps are taken on weekends frequently, lasting from  45-60 minutes and are less refreshing than nocturnal sleep.   REVIEW OF SYSTEMS: Out of a complete 14 system review of symptoms, the patient complains only of the following symptoms, and all other  reviewed systems are negative.   ALLERGIES: Allergies  Allergen Reactions   Zoloft [Sertraline Hcl] Other (See Comments)   Wellbutrin [Bupropion] Rash     HOME MEDICATIONS: Outpatient Medications Prior to Visit  Medication Sig Dispense Refill   atorvastatin (LIPITOR) 20 MG tablet Take 20 mg by mouth daily.     clonazePAM (KLONOPIN) 0.5 MG tablet Take 1 mg by mouth 2 (two) times daily as needed for anxiety.      mirtazapine (REMERON) 30 MG tablet Take 30 mg by  mouth at bedtime.      venlafaxine XR (EFFEXOR-XR) 150 MG 24 hr capsule Take 300 mg by mouth daily.      No facility-administered medications prior to visit.     PAST MEDICAL HISTORY: Past Medical History:  Diagnosis Date   Androgen deficiency 08/16/2013   from medical record Dr Shelia Media   Body mass index 34.0-34.9, adult 12/08/2018   Dr Shelia Media   Depression with anxiety 06/25/2017   Dr Shelia Media   Esophageal reflux 08/25/2018   Dr Shelia Media   GERD (gastroesophageal reflux disease)    Hypercalcemia 12/17/2018   Dr Shelia Media   Liver enzyme elevation    Major depressive disorder 06/25/2017   Dr Shelia Media   Mixed hyperlipidemia 12/17/2018   Dr Shelia Media   Nausea 08/25/2018   Dr Shelia Media   OSA on CPAP    Primary insomnia 04/28/2018   Dr Shelia Media     PAST SURGICAL HISTORY: Past Surgical History:  Procedure Laterality Date   ROTATOR CUFF REPAIR Right 2013   Dr Tanda Rockers   SHOULDER SURGERY     RIGHT x2   wrist ligament repair Left      FAMILY HISTORY: No family history on file.   SOCIAL HISTORY: Social History   Socioeconomic History   Marital status: Single    Spouse name: Not on file   Number of children: Not on file   Years of education: Not on file   Highest education level: Not on file  Occupational History   Not on file  Tobacco Use   Smoking status: Never   Smokeless tobacco: Never  Vaping Use   Vaping Use: Never used  Substance and Sexual Activity   Alcohol use: No   Drug use: No   Sexual activity: Not on file  Other Topics Concern   Not on file  Social History Narrative   Not on file   Social Determinants of Health   Financial Resource Strain: Not on file  Food Insecurity: Not on file  Transportation Needs: Not on file  Physical Activity: Not on file  Stress: Not on file  Social Connections: Not on file  Intimate Partner Violence: Not on file     PHYSICAL EXAM  There were no vitals filed for this visit. There is no height or weight on file to calculate  BMI.   Generalized: Well developed, in no acute distress  Cardiology: normal rate and rhythm, no murmur auscultated  Respiratory: clear to auscultation bilaterally    Neurological examination  Mentation: Alert oriented to time, place, history taking. Follows all commands speech and language fluent Cranial nerve II-XII: Pupils were equal round reactive to light. Extraocular movements were full, visual field were full on confrontational test. Facial sensation and strength were normal. Uvula tongue midline. Head turning and shoulder shrug  were normal and symmetric. Motor: The motor testing reveals 5 over 5 strength of all 4 extremities. Good symmetric motor tone is noted throughout.  Sensory: Sensory testing is intact  to soft touch on all 4 extremities. No evidence of extinction is noted.  Coordination: Cerebellar testing reveals good finger-nose-finger and heel-to-shin bilaterally.  Gait and station: Gait is normal. Tandem gait is normal. Romberg is negative. No drift is seen.  Reflexes: Deep tendon reflexes are symmetric and normal bilaterally.    DIAGNOSTIC DATA (LABS, IMAGING, TESTING) - I reviewed patient records, labs, notes, testing and imaging myself where available.  Lab Results  Component Value Date   WBC 10.4 04/25/2010   HGB 14.6 04/25/2010   HCT 42.9 04/25/2010   MCV 85.5 04/25/2010   PLT 267 04/25/2010      Component Value Date/Time   NA 140 04/25/2010 0705   K 3.8 04/25/2010 0705   CL 104 04/25/2010 0705   CO2 28 04/25/2010 0705   GLUCOSE 101 (H) 04/25/2010 0705   BUN 10 04/25/2010 0705   CREATININE 0.92 04/25/2010 0705   CALCIUM 9.5 04/25/2010 0705   GFRNONAA >60 04/25/2010 0705   GFRAA  04/25/2010 0705    >60        The eGFR has been calculated using the MDRD equation. This calculation has not been validated in all clinical situations. eGFR's persistently <60 mL/min signify possible Chronic Kidney Disease.   No results found for: CHOL, HDL, LDLCALC,  LDLDIRECT, TRIG, CHOLHDL No results found for: HGBA1C No results found for: VITAMINB12 No results found for: TSH  No flowsheet data found.   No flowsheet data found.   ASSESSMENT AND PLAN  35 y.o. year old male  has a past medical history of Androgen deficiency (08/16/2013), Body mass index 34.0-34.9, adult (12/08/2018), Depression with anxiety (06/25/2017), Esophageal reflux (08/25/2018), GERD (gastroesophageal reflux disease), Hypercalcemia (12/17/2018), Liver enzyme elevation, Major depressive disorder (06/25/2017), Mixed hyperlipidemia (12/17/2018), Nausea (08/25/2018), OSA on CPAP, and Primary insomnia (04/28/2018). here with    Psychophysiological insomnia  CSA (central sleep apnea)   No orders of the defined types were placed in this encounter.    No orders of the defined types were placed in this encounter.     Debbora Presto, MSN, FNP-C 01/17/2021, 7:09 AM  [facility] [address] [place], [place] [address] [phone]

## 2021-01-17 NOTE — Addendum Note (Signed)
Addended by: Debbora Presto L on: 01/17/2021 07:23 AM   Modules accepted: Orders

## 2021-01-19 DIAGNOSIS — B001 Herpesviral vesicular dermatitis: Secondary | ICD-10-CM | POA: Diagnosis not present

## 2021-01-19 DIAGNOSIS — R69 Illness, unspecified: Secondary | ICD-10-CM | POA: Diagnosis not present

## 2021-01-19 DIAGNOSIS — E782 Mixed hyperlipidemia: Secondary | ICD-10-CM | POA: Diagnosis not present

## 2021-01-19 DIAGNOSIS — Z6832 Body mass index (BMI) 32.0-32.9, adult: Secondary | ICD-10-CM | POA: Diagnosis not present

## 2021-01-19 DIAGNOSIS — G4733 Obstructive sleep apnea (adult) (pediatric): Secondary | ICD-10-CM | POA: Diagnosis not present

## 2021-01-19 DIAGNOSIS — R7309 Other abnormal glucose: Secondary | ICD-10-CM | POA: Diagnosis not present

## 2021-01-19 DIAGNOSIS — Z0000 Encounter for general adult medical examination without abnormal findings: Secondary | ICD-10-CM | POA: Diagnosis not present

## 2021-01-22 ENCOUNTER — Other Ambulatory Visit: Payer: Self-pay | Admitting: Internal Medicine

## 2021-01-22 DIAGNOSIS — M545 Low back pain, unspecified: Secondary | ICD-10-CM | POA: Diagnosis not present

## 2021-01-22 DIAGNOSIS — E7800 Pure hypercholesterolemia, unspecified: Secondary | ICD-10-CM

## 2021-01-22 DIAGNOSIS — M542 Cervicalgia: Secondary | ICD-10-CM | POA: Diagnosis not present

## 2021-01-22 DIAGNOSIS — M5032 Other cervical disc degeneration, mid-cervical region, unspecified level: Secondary | ICD-10-CM | POA: Diagnosis not present

## 2021-01-22 DIAGNOSIS — M9901 Segmental and somatic dysfunction of cervical region: Secondary | ICD-10-CM | POA: Diagnosis not present

## 2021-01-22 DIAGNOSIS — M9903 Segmental and somatic dysfunction of lumbar region: Secondary | ICD-10-CM | POA: Diagnosis not present

## 2021-01-23 DIAGNOSIS — J302 Other seasonal allergic rhinitis: Secondary | ICD-10-CM | POA: Diagnosis not present

## 2021-01-23 DIAGNOSIS — M5032 Other cervical disc degeneration, mid-cervical region, unspecified level: Secondary | ICD-10-CM | POA: Diagnosis not present

## 2021-01-23 DIAGNOSIS — M9903 Segmental and somatic dysfunction of lumbar region: Secondary | ICD-10-CM | POA: Diagnosis not present

## 2021-01-23 DIAGNOSIS — M545 Low back pain, unspecified: Secondary | ICD-10-CM | POA: Diagnosis not present

## 2021-01-23 DIAGNOSIS — M9901 Segmental and somatic dysfunction of cervical region: Secondary | ICD-10-CM | POA: Diagnosis not present

## 2021-01-23 DIAGNOSIS — Z6832 Body mass index (BMI) 32.0-32.9, adult: Secondary | ICD-10-CM | POA: Diagnosis not present

## 2021-01-23 DIAGNOSIS — J3489 Other specified disorders of nose and nasal sinuses: Secondary | ICD-10-CM | POA: Diagnosis not present

## 2021-01-23 DIAGNOSIS — M542 Cervicalgia: Secondary | ICD-10-CM | POA: Diagnosis not present

## 2021-01-24 DIAGNOSIS — M5032 Other cervical disc degeneration, mid-cervical region, unspecified level: Secondary | ICD-10-CM | POA: Diagnosis not present

## 2021-01-24 DIAGNOSIS — M9901 Segmental and somatic dysfunction of cervical region: Secondary | ICD-10-CM | POA: Diagnosis not present

## 2021-01-24 DIAGNOSIS — M542 Cervicalgia: Secondary | ICD-10-CM | POA: Diagnosis not present

## 2021-01-24 DIAGNOSIS — M9903 Segmental and somatic dysfunction of lumbar region: Secondary | ICD-10-CM | POA: Diagnosis not present

## 2021-01-24 DIAGNOSIS — M545 Low back pain, unspecified: Secondary | ICD-10-CM | POA: Diagnosis not present

## 2021-01-25 NOTE — Telephone Encounter (Signed)
Working on this patient's referral for sleep psych. I called [facility] but they do not offer those services. Case, do you know of a place I could send this referral?

## 2021-01-26 DIAGNOSIS — M5032 Other cervical disc degeneration, mid-cervical region, unspecified level: Secondary | ICD-10-CM | POA: Diagnosis not present

## 2021-01-26 DIAGNOSIS — M542 Cervicalgia: Secondary | ICD-10-CM | POA: Diagnosis not present

## 2021-01-26 DIAGNOSIS — M9903 Segmental and somatic dysfunction of lumbar region: Secondary | ICD-10-CM | POA: Diagnosis not present

## 2021-01-26 DIAGNOSIS — M9901 Segmental and somatic dysfunction of cervical region: Secondary | ICD-10-CM | POA: Diagnosis not present

## 2021-01-29 DIAGNOSIS — M9901 Segmental and somatic dysfunction of cervical region: Secondary | ICD-10-CM | POA: Diagnosis not present

## 2021-01-29 DIAGNOSIS — M5032 Other cervical disc degeneration, mid-cervical region, unspecified level: Secondary | ICD-10-CM | POA: Diagnosis not present

## 2021-01-29 DIAGNOSIS — M542 Cervicalgia: Secondary | ICD-10-CM | POA: Diagnosis not present

## 2021-01-29 DIAGNOSIS — M9903 Segmental and somatic dysfunction of lumbar region: Secondary | ICD-10-CM | POA: Diagnosis not present

## 2021-01-29 NOTE — Telephone Encounter (Signed)
Referral sent to [facility]. P: [phone]

## 2021-01-31 DIAGNOSIS — M542 Cervicalgia: Secondary | ICD-10-CM | POA: Diagnosis not present

## 2021-01-31 DIAGNOSIS — M9903 Segmental and somatic dysfunction of lumbar region: Secondary | ICD-10-CM | POA: Diagnosis not present

## 2021-01-31 DIAGNOSIS — M5032 Other cervical disc degeneration, mid-cervical region, unspecified level: Secondary | ICD-10-CM | POA: Diagnosis not present

## 2021-01-31 DIAGNOSIS — M9901 Segmental and somatic dysfunction of cervical region: Secondary | ICD-10-CM | POA: Diagnosis not present

## 2021-02-01 DIAGNOSIS — M9901 Segmental and somatic dysfunction of cervical region: Secondary | ICD-10-CM | POA: Diagnosis not present

## 2021-02-01 DIAGNOSIS — M5032 Other cervical disc degeneration, mid-cervical region, unspecified level: Secondary | ICD-10-CM | POA: Diagnosis not present

## 2021-02-01 DIAGNOSIS — M542 Cervicalgia: Secondary | ICD-10-CM | POA: Diagnosis not present

## 2021-02-01 DIAGNOSIS — M9903 Segmental and somatic dysfunction of lumbar region: Secondary | ICD-10-CM | POA: Diagnosis not present

## 2021-02-06 DIAGNOSIS — M9901 Segmental and somatic dysfunction of cervical region: Secondary | ICD-10-CM | POA: Diagnosis not present

## 2021-02-06 DIAGNOSIS — M5032 Other cervical disc degeneration, mid-cervical region, unspecified level: Secondary | ICD-10-CM | POA: Diagnosis not present

## 2021-02-06 DIAGNOSIS — M9903 Segmental and somatic dysfunction of lumbar region: Secondary | ICD-10-CM | POA: Diagnosis not present

## 2021-02-06 DIAGNOSIS — M542 Cervicalgia: Secondary | ICD-10-CM | POA: Diagnosis not present

## 2021-02-08 DIAGNOSIS — M5032 Other cervical disc degeneration, mid-cervical region, unspecified level: Secondary | ICD-10-CM | POA: Diagnosis not present

## 2021-02-08 DIAGNOSIS — M542 Cervicalgia: Secondary | ICD-10-CM | POA: Diagnosis not present

## 2021-02-08 DIAGNOSIS — M9903 Segmental and somatic dysfunction of lumbar region: Secondary | ICD-10-CM | POA: Diagnosis not present

## 2021-02-08 DIAGNOSIS — M9901 Segmental and somatic dysfunction of cervical region: Secondary | ICD-10-CM | POA: Diagnosis not present

## 2021-02-09 DIAGNOSIS — M5032 Other cervical disc degeneration, mid-cervical region, unspecified level: Secondary | ICD-10-CM | POA: Diagnosis not present

## 2021-02-09 DIAGNOSIS — M9901 Segmental and somatic dysfunction of cervical region: Secondary | ICD-10-CM | POA: Diagnosis not present

## 2021-02-09 DIAGNOSIS — M542 Cervicalgia: Secondary | ICD-10-CM | POA: Diagnosis not present

## 2021-02-09 DIAGNOSIS — M9903 Segmental and somatic dysfunction of lumbar region: Secondary | ICD-10-CM | POA: Diagnosis not present

## 2021-02-10 DIAGNOSIS — G4733 Obstructive sleep apnea (adult) (pediatric): Secondary | ICD-10-CM | POA: Diagnosis not present

## 2021-02-13 DIAGNOSIS — M5032 Other cervical disc degeneration, mid-cervical region, unspecified level: Secondary | ICD-10-CM | POA: Diagnosis not present

## 2021-02-13 DIAGNOSIS — M542 Cervicalgia: Secondary | ICD-10-CM | POA: Diagnosis not present

## 2021-02-13 DIAGNOSIS — M9903 Segmental and somatic dysfunction of lumbar region: Secondary | ICD-10-CM | POA: Diagnosis not present

## 2021-02-13 DIAGNOSIS — M9901 Segmental and somatic dysfunction of cervical region: Secondary | ICD-10-CM | POA: Diagnosis not present

## 2021-02-15 ENCOUNTER — Ambulatory Visit
Admission: RE | Admit: 2021-02-15 | Discharge: 2021-02-15 | Disposition: A | Discharge status: Home / Self Care | Payer: No Typology Code available for payment source | Source: Ambulatory Visit | Attending: Internal Medicine | Admitting: Internal Medicine

## 2021-02-15 ENCOUNTER — Other Ambulatory Visit: Payer: Self-pay

## 2021-02-15 DIAGNOSIS — E7800 Pure hypercholesterolemia, unspecified: Secondary | ICD-10-CM

## 2021-02-15 DIAGNOSIS — M9903 Segmental and somatic dysfunction of lumbar region: Secondary | ICD-10-CM | POA: Diagnosis not present

## 2021-02-15 DIAGNOSIS — M9901 Segmental and somatic dysfunction of cervical region: Secondary | ICD-10-CM | POA: Diagnosis not present

## 2021-02-15 DIAGNOSIS — M542 Cervicalgia: Secondary | ICD-10-CM | POA: Diagnosis not present

## 2021-02-15 DIAGNOSIS — M5032 Other cervical disc degeneration, mid-cervical region, unspecified level: Secondary | ICD-10-CM | POA: Diagnosis not present

## 2021-02-16 DIAGNOSIS — M5032 Other cervical disc degeneration, mid-cervical region, unspecified level: Secondary | ICD-10-CM | POA: Diagnosis not present

## 2021-02-16 DIAGNOSIS — M9901 Segmental and somatic dysfunction of cervical region: Secondary | ICD-10-CM | POA: Diagnosis not present

## 2021-02-16 DIAGNOSIS — M542 Cervicalgia: Secondary | ICD-10-CM | POA: Diagnosis not present

## 2021-02-16 DIAGNOSIS — M9903 Segmental and somatic dysfunction of lumbar region: Secondary | ICD-10-CM | POA: Diagnosis not present

## 2021-02-21 DIAGNOSIS — M542 Cervicalgia: Secondary | ICD-10-CM | POA: Diagnosis not present

## 2021-02-21 DIAGNOSIS — M9903 Segmental and somatic dysfunction of lumbar region: Secondary | ICD-10-CM | POA: Diagnosis not present

## 2021-02-21 DIAGNOSIS — M9901 Segmental and somatic dysfunction of cervical region: Secondary | ICD-10-CM | POA: Diagnosis not present

## 2021-02-21 DIAGNOSIS — M5032 Other cervical disc degeneration, mid-cervical region, unspecified level: Secondary | ICD-10-CM | POA: Diagnosis not present

## 2021-02-27 DIAGNOSIS — M9903 Segmental and somatic dysfunction of lumbar region: Secondary | ICD-10-CM | POA: Diagnosis not present

## 2021-02-27 DIAGNOSIS — M542 Cervicalgia: Secondary | ICD-10-CM | POA: Diagnosis not present

## 2021-02-27 DIAGNOSIS — M5032 Other cervical disc degeneration, mid-cervical region, unspecified level: Secondary | ICD-10-CM | POA: Diagnosis not present

## 2021-02-27 DIAGNOSIS — M9901 Segmental and somatic dysfunction of cervical region: Secondary | ICD-10-CM | POA: Diagnosis not present

## 2021-02-28 DIAGNOSIS — M9903 Segmental and somatic dysfunction of lumbar region: Secondary | ICD-10-CM | POA: Diagnosis not present

## 2021-02-28 DIAGNOSIS — M542 Cervicalgia: Secondary | ICD-10-CM | POA: Diagnosis not present

## 2021-02-28 DIAGNOSIS — M9901 Segmental and somatic dysfunction of cervical region: Secondary | ICD-10-CM | POA: Diagnosis not present

## 2021-02-28 DIAGNOSIS — M5032 Other cervical disc degeneration, mid-cervical region, unspecified level: Secondary | ICD-10-CM | POA: Diagnosis not present

## 2021-03-01 DIAGNOSIS — E782 Mixed hyperlipidemia: Secondary | ICD-10-CM | POA: Diagnosis not present

## 2021-03-01 DIAGNOSIS — R7989 Other specified abnormal findings of blood chemistry: Secondary | ICD-10-CM | POA: Diagnosis not present

## 2021-03-01 DIAGNOSIS — R69 Illness, unspecified: Secondary | ICD-10-CM | POA: Diagnosis not present

## 2021-03-01 DIAGNOSIS — E291 Testicular hypofunction: Secondary | ICD-10-CM | POA: Diagnosis not present

## 2021-03-02 DIAGNOSIS — M67911 Unspecified disorder of synovium and tendon, right shoulder: Secondary | ICD-10-CM | POA: Diagnosis not present

## 2021-03-06 DIAGNOSIS — M542 Cervicalgia: Secondary | ICD-10-CM | POA: Diagnosis not present

## 2021-03-06 DIAGNOSIS — M5032 Other cervical disc degeneration, mid-cervical region, unspecified level: Secondary | ICD-10-CM | POA: Diagnosis not present

## 2021-03-06 DIAGNOSIS — M9901 Segmental and somatic dysfunction of cervical region: Secondary | ICD-10-CM | POA: Diagnosis not present

## 2021-03-06 DIAGNOSIS — M9903 Segmental and somatic dysfunction of lumbar region: Secondary | ICD-10-CM | POA: Diagnosis not present

## 2021-03-07 DIAGNOSIS — M9901 Segmental and somatic dysfunction of cervical region: Secondary | ICD-10-CM | POA: Diagnosis not present

## 2021-03-07 DIAGNOSIS — M542 Cervicalgia: Secondary | ICD-10-CM | POA: Diagnosis not present

## 2021-03-07 DIAGNOSIS — M5032 Other cervical disc degeneration, mid-cervical region, unspecified level: Secondary | ICD-10-CM | POA: Diagnosis not present

## 2021-03-07 DIAGNOSIS — M9903 Segmental and somatic dysfunction of lumbar region: Secondary | ICD-10-CM | POA: Diagnosis not present

## 2021-03-13 DIAGNOSIS — G4733 Obstructive sleep apnea (adult) (pediatric): Secondary | ICD-10-CM | POA: Diagnosis not present

## 2021-03-13 DIAGNOSIS — M9901 Segmental and somatic dysfunction of cervical region: Secondary | ICD-10-CM | POA: Diagnosis not present

## 2021-03-13 DIAGNOSIS — M9903 Segmental and somatic dysfunction of lumbar region: Secondary | ICD-10-CM | POA: Diagnosis not present

## 2021-03-13 DIAGNOSIS — M542 Cervicalgia: Secondary | ICD-10-CM | POA: Diagnosis not present

## 2021-03-13 DIAGNOSIS — M5032 Other cervical disc degeneration, mid-cervical region, unspecified level: Secondary | ICD-10-CM | POA: Diagnosis not present

## 2021-03-21 DIAGNOSIS — R197 Diarrhea, unspecified: Secondary | ICD-10-CM | POA: Diagnosis not present

## 2021-03-21 DIAGNOSIS — R194 Change in bowel habit: Secondary | ICD-10-CM | POA: Diagnosis not present

## 2021-03-21 DIAGNOSIS — K219 Gastro-esophageal reflux disease without esophagitis: Secondary | ICD-10-CM | POA: Diagnosis not present

## 2021-03-22 DIAGNOSIS — M5136 Other intervertebral disc degeneration, lumbar region: Secondary | ICD-10-CM | POA: Diagnosis not present

## 2021-03-23 DIAGNOSIS — M5136 Other intervertebral disc degeneration, lumbar region: Secondary | ICD-10-CM | POA: Diagnosis not present

## 2021-04-03 DIAGNOSIS — R69 Illness, unspecified: Secondary | ICD-10-CM | POA: Diagnosis not present

## 2021-04-03 DIAGNOSIS — R5383 Other fatigue: Secondary | ICD-10-CM | POA: Diagnosis not present

## 2021-04-03 DIAGNOSIS — Z713 Dietary counseling and surveillance: Secondary | ICD-10-CM | POA: Diagnosis not present

## 2021-04-03 DIAGNOSIS — Z6834 Body mass index (BMI) 34.0-34.9, adult: Secondary | ICD-10-CM | POA: Diagnosis not present

## 2021-04-03 DIAGNOSIS — E291 Testicular hypofunction: Secondary | ICD-10-CM | POA: Diagnosis not present

## 2021-04-10 DIAGNOSIS — H5712 Ocular pain, left eye: Secondary | ICD-10-CM | POA: Diagnosis not present

## 2021-04-11 DIAGNOSIS — M5136 Other intervertebral disc degeneration, lumbar region: Secondary | ICD-10-CM | POA: Diagnosis not present

## 2021-05-05 DIAGNOSIS — G4733 Obstructive sleep apnea (adult) (pediatric): Secondary | ICD-10-CM | POA: Diagnosis not present

## 2021-05-07 DIAGNOSIS — R197 Diarrhea, unspecified: Secondary | ICD-10-CM | POA: Diagnosis not present

## 2021-05-09 DIAGNOSIS — R197 Diarrhea, unspecified: Secondary | ICD-10-CM | POA: Diagnosis not present

## 2021-05-09 DIAGNOSIS — R140 Abdominal distension (gaseous): Secondary | ICD-10-CM | POA: Diagnosis not present

## 2021-05-11 DIAGNOSIS — M67911 Unspecified disorder of synovium and tendon, right shoulder: Secondary | ICD-10-CM | POA: Diagnosis not present

## 2021-05-16 DIAGNOSIS — K219 Gastro-esophageal reflux disease without esophagitis: Secondary | ICD-10-CM | POA: Diagnosis not present

## 2021-05-16 DIAGNOSIS — R140 Abdominal distension (gaseous): Secondary | ICD-10-CM | POA: Diagnosis not present

## 2021-05-16 DIAGNOSIS — R197 Diarrhea, unspecified: Secondary | ICD-10-CM | POA: Diagnosis not present

## 2021-05-22 DIAGNOSIS — R197 Diarrhea, unspecified: Secondary | ICD-10-CM | POA: Diagnosis not present

## 2021-05-23 DIAGNOSIS — M25511 Pain in right shoulder: Secondary | ICD-10-CM | POA: Diagnosis not present

## 2021-06-04 DIAGNOSIS — G4733 Obstructive sleep apnea (adult) (pediatric): Secondary | ICD-10-CM | POA: Diagnosis not present

## 2021-06-04 DIAGNOSIS — M67911 Unspecified disorder of synovium and tendon, right shoulder: Secondary | ICD-10-CM | POA: Diagnosis not present

## 2021-06-27 DIAGNOSIS — G4733 Obstructive sleep apnea (adult) (pediatric): Secondary | ICD-10-CM | POA: Diagnosis not present

## 2021-07-14 DIAGNOSIS — M545 Low back pain, unspecified: Secondary | ICD-10-CM | POA: Diagnosis not present

## 2021-07-28 DIAGNOSIS — G4733 Obstructive sleep apnea (adult) (pediatric): Secondary | ICD-10-CM | POA: Diagnosis not present

## 2021-08-20 DIAGNOSIS — N50812 Left testicular pain: Secondary | ICD-10-CM | POA: Diagnosis not present

## 2021-08-28 DIAGNOSIS — G4733 Obstructive sleep apnea (adult) (pediatric): Secondary | ICD-10-CM | POA: Diagnosis not present

## 2021-09-10 DIAGNOSIS — R140 Abdominal distension (gaseous): Secondary | ICD-10-CM | POA: Diagnosis not present

## 2021-09-10 DIAGNOSIS — R197 Diarrhea, unspecified: Secondary | ICD-10-CM | POA: Diagnosis not present

## 2021-09-10 DIAGNOSIS — K219 Gastro-esophageal reflux disease without esophagitis: Secondary | ICD-10-CM | POA: Diagnosis not present

## 2021-09-13 DIAGNOSIS — M9903 Segmental and somatic dysfunction of lumbar region: Secondary | ICD-10-CM | POA: Diagnosis not present

## 2021-09-13 DIAGNOSIS — M9904 Segmental and somatic dysfunction of sacral region: Secondary | ICD-10-CM | POA: Diagnosis not present

## 2021-09-13 DIAGNOSIS — M5386 Other specified dorsopathies, lumbar region: Secondary | ICD-10-CM | POA: Diagnosis not present

## 2021-09-13 DIAGNOSIS — M9905 Segmental and somatic dysfunction of pelvic region: Secondary | ICD-10-CM | POA: Diagnosis not present

## 2021-09-17 DIAGNOSIS — M9904 Segmental and somatic dysfunction of sacral region: Secondary | ICD-10-CM | POA: Diagnosis not present

## 2021-09-17 DIAGNOSIS — M5386 Other specified dorsopathies, lumbar region: Secondary | ICD-10-CM | POA: Diagnosis not present

## 2021-09-17 DIAGNOSIS — M9903 Segmental and somatic dysfunction of lumbar region: Secondary | ICD-10-CM | POA: Diagnosis not present

## 2021-09-17 DIAGNOSIS — M9905 Segmental and somatic dysfunction of pelvic region: Secondary | ICD-10-CM | POA: Diagnosis not present

## 2021-09-21 DIAGNOSIS — N50812 Left testicular pain: Secondary | ICD-10-CM | POA: Diagnosis not present

## 2021-09-21 DIAGNOSIS — I861 Scrotal varices: Secondary | ICD-10-CM | POA: Diagnosis not present

## 2021-09-25 DIAGNOSIS — G4733 Obstructive sleep apnea (adult) (pediatric): Secondary | ICD-10-CM | POA: Diagnosis not present

## 2021-10-09 ENCOUNTER — Telehealth: Payer: Self-pay

## 2021-10-09 NOTE — Telephone Encounter (Signed)
Called pt to schedule his CPAP study but was unable to leave a message due to pt's VM being full ?

## 2021-10-10 DIAGNOSIS — K580 Irritable bowel syndrome with diarrhea: Secondary | ICD-10-CM | POA: Diagnosis not present

## 2021-10-22 DIAGNOSIS — M9904 Segmental and somatic dysfunction of sacral region: Secondary | ICD-10-CM | POA: Diagnosis not present

## 2021-10-22 DIAGNOSIS — M5386 Other specified dorsopathies, lumbar region: Secondary | ICD-10-CM | POA: Diagnosis not present

## 2021-10-22 DIAGNOSIS — M9905 Segmental and somatic dysfunction of pelvic region: Secondary | ICD-10-CM | POA: Diagnosis not present

## 2021-10-22 DIAGNOSIS — M9903 Segmental and somatic dysfunction of lumbar region: Secondary | ICD-10-CM | POA: Diagnosis not present

## 2021-10-25 ENCOUNTER — Ambulatory Visit (INDEPENDENT_AMBULATORY_CARE_PROVIDER_SITE_OTHER): Payer: 59 | Admitting: Neurology

## 2021-10-25 DIAGNOSIS — G478 Other sleep disorders: Secondary | ICD-10-CM

## 2021-10-25 DIAGNOSIS — G4731 Primary central sleep apnea: Secondary | ICD-10-CM

## 2021-10-25 DIAGNOSIS — Z9189 Other specified personal risk factors, not elsewhere classified: Secondary | ICD-10-CM

## 2021-10-25 DIAGNOSIS — M533 Sacrococcygeal disorders, not elsewhere classified: Secondary | ICD-10-CM | POA: Diagnosis not present

## 2021-10-25 DIAGNOSIS — F5104 Psychophysiologic insomnia: Secondary | ICD-10-CM

## 2021-10-25 DIAGNOSIS — M5451 Vertebrogenic low back pain: Secondary | ICD-10-CM | POA: Diagnosis not present

## 2021-10-25 DIAGNOSIS — N50812 Left testicular pain: Secondary | ICD-10-CM | POA: Diagnosis not present

## 2021-10-26 DIAGNOSIS — G4733 Obstructive sleep apnea (adult) (pediatric): Secondary | ICD-10-CM | POA: Diagnosis not present

## 2021-10-29 DIAGNOSIS — Z6831 Body mass index (BMI) 31.0-31.9, adult: Secondary | ICD-10-CM | POA: Diagnosis not present

## 2021-10-29 DIAGNOSIS — G8929 Other chronic pain: Secondary | ICD-10-CM | POA: Diagnosis not present

## 2021-10-29 DIAGNOSIS — M545 Low back pain, unspecified: Secondary | ICD-10-CM | POA: Diagnosis not present

## 2021-10-30 NOTE — Procedures (Signed)
PATIENT'S NAME:  Erik Walker, Erik Walker. ?DOB:      August 13, 1985      ?MR#:    [id number]     ?DATE OF RECORDING: 10/25/2021 ?REFERRING M.D.:  Deland Pretty, MD. ?Study Performed:   Titration to positive airway pressure ?HISTORY:  patient here to finally follow up on sleep study from May 2022: [name]. ? ?SY [name] presented first on 03-20-2020 with an insomnia disorder, arising over the previous 3-4 month, now chronic. He has a medical history of Androgen deficiency (08/16/2013), Body mass index 34.0-34.9, adult (12/08/2018), Depression with anxiety (06/25/2017), 08/25/2018- GERD (gastroesophageal reflux disease), Hypercalcemia (12/17/2018), Liver enzyme elevation, Major depressive disorder (06/25/2017), Mixed hyperlipidemia (12/17/2018), Anxiety manifesting as chronic Nausea (08/25/2018), and (47/65/4650)/ Cyclic insomnia, related to depression and stress, claustrophobia. We ordered a sleep study but the patient only went through by may the following year, and now is again seen another year later.  ?The patient had a follow up PSG here at [facility] on 11/16/2020   ?1.        Severe Central Sleep Apnea with an AHI of 50.1/h. ?2.        A positional component was present as right sided sleep was associated with highest AHI. ?3.        Hypoxemia for a total time of 15 minutes and nadir of 85%. Also clustered in right sided sleep   ?4.        Non-specific abnormal EKG ?  ?RECOMMENDATIONS: Advise full night, attended, PAP titration study to optimize therapy and possibly change to BiPAP/ASV. ?   ?The patient endorsed the Epworth Sleepiness Scale at 6 points and the Fatigue Score at 40/63 points.   ?The patient's weight 207 pounds with a height of 70 (inches), resulting in a BMI of 29.7 kg/m2. ?The patient's neck circumference measured 17.5 inches. ? ?CURRENT MEDICATIONS: Lipitor, Klonopin, Remeron, Effexor-XR ? ?  ?PROCEDURE:  This is a multichannel digital polysomnogram utilizing the SomnoStar 11.2 system.   Electrodes and sensors were applied and monitored per AASM Specifications.   EEG, EOG, Chin and Limb EMG, were sampled at 200 Hz.  ECG, Snore and Nasal Pressure, Thermal Airflow, Respiratory Effort, CPAP Flow and Pressure, Oximetry was sampled at 50 Hz. Digital video and audio were recorded.     ? ? ?CPAP was initiated at 5 cmH20 under a FFM AirFit F20 medium-(by patient's choice?) with heated humidity per AASM standards and pressure was advanced to CPAP at 9 cm water, AHI was 11.7/h switched to BIPAP between 10/5 and advanced to BiPAP ST finally at 18/13/cmH20, rate 12. ?At the final BIPAP 18/ 13 pressure with ST rate 12, there was only one minute of sleep recorded- a reduction of the AHI to 0 does therefor not mean any true improvement of sleep apnea. ?The best result was seen under 12 over 7 cm water BiPAP with St 10. 42 minutes of sleep, the AHI was 0/h. while the patient slept neither in supine nor REM sleep  ? ?Lights Out was at 21:43 and Lights On at 04:53. Total recording time (TRT) was 430 minutes, with a total sleep time (TST) of 357 minutes. The patient's sleep latency was 23.5 minutes. REM latency was 345.5 minutes.  The sleep efficiency was 83.1 %.   ? ?SLEEP ARCHITECTURE: WASO (Wake after sleep onset) was 15.5 minutes.  There were 12.5 minutes in Stage N1, 324.5 minutes Stage N2, 0 minutes Stage N3 and 20 minutes in Stage REM.  The percentage  of Stage N1 was 3.5%, Stage N2 was 90.9%, Stage N3 was 0% and Stage R (REM sleep) was 5.6%. The sleep architecture was notable for unfragmented sleep without REM sleep. ? ?RESPIRATORY ANALYSIS:  There was a total of 117 respiratory events: 0 obstructive apneas, 66 central apneas and 1 mixed apneas with a total of 67 apneas and an apnea index (AHI) of 11.3 /hour. There were 50 hypopneas with a hypopnea index of 8.4/hour.  ? ?The total APNEA/HYPOPNEA INDEX (AHI) was 19.7 /hour.  1 event occurred in REM sleep and 116 events in NREM. The REM AHI was 3 /hour versus a  non-REM AHI of 20.7 /hour.  ? The patient spent 40 minutes of total sleep time in the supine position and 317 minutes in non-supine. The supine AHI was 46.5, versus a non-supine AHI of 16.3. ? ?OXYGEN SATURATION & C02:  The baseline 02 saturation was 92%, with the lowest being 85%. Time spent below 89% saturation equaled 20 minutes. ? ?The arousals were noted as: 36 were spontaneous, 0 were associated with PLMs, 12 were associated with respiratory events. ?The patient had a total of 0 Periodic Limb Movements.  ?Audio and video analysis did not show any abnormal or unusual movements, behaviors, phonations or vocalizations.   ?EKG was in keeping with normal sinus rhythm. ?The patient was provided a Medium AirFit F20 full face mask. ? ?DIAGNOSIS ?Primary Central Sleep Apnea responding to BiPAP settings of 12/7 cm water pressure ST 10, The patient was provided a Medium AirFit F20 full face mask. ?A change to ASV was due to time restriction not possible. ? ?PLANS/RECOMMENDATIONS: I will provide an auto BiPAP ST with a pressure range of 5 cm spread - 12/7 cm, ST 10. Medium AirFit F20 full face mask. ? ? DISCUSSION: A follow up appointment will be scheduled in the Sleep Clinic at [facility].  BiPAP therapy compliance is defined as 4 hours or more with nightly use.  Please call [phone] with any questions.    ? ? ?I certify that I have reviewed the entire raw data recording prior to the issuance of this report in accordance with the Standards of Accreditation of the [place] [employer] (AASM) ? ? ? ? ? ?[name], M.D. ?Diplomat, Tax adviser of Psychiatry and Neurology  ?Diplomat, Tax adviser of Sleep Medicine ?Medical Director, [facility] at [facility] ?

## 2021-10-30 NOTE — Addendum Note (Signed)
Addended by: [name] on: 10/30/2021 05:10 PM ? ? Modules accepted: Orders ? ?

## 2021-10-31 ENCOUNTER — Telehealth: Payer: Self-pay | Admitting: Neurology

## 2021-10-31 NOTE — Telephone Encounter (Signed)
Called patient to discuss sleep study results. No answer at this time. LVM for the patient to call back.   

## 2021-10-31 NOTE — Telephone Encounter (Signed)
-----   Message from [name], MD sent at 10/30/2021  5:08 PM EDT ----- ? The supine AHI was 46.5, versus a non-supine AHI of 16.3. ?? ?OXYGEN SATURATION & C02:  The baseline 02 saturation was 92%, with the lowest being 85%. Time spent below 89% saturation equaled 20 minutes. ?? ?The arousals were noted as: 36 were spontaneous, 0 were associated with PLMs, 12 were associated with respiratory events. ?The patient had a total of 0 Periodic Limb Movements.  ?Audio and video analysis did not show any abnormal or unusual movements, behaviors, phonations or vocalizations.   ?EKG was in keeping with normal sinus rhythm. ?The patient was provided a Medium AirFit F20 full face mask. ?? ?DIAGNOSIS ?Primary Central Sleep Apnea responding to BiPAP settings of 12/7 cm water pressure ST 10, The patient was provided a Medium AirFit F20 full face mask. ?A change to ASV was due to time restriction not possible. ?? ?PLANS/RECOMMENDATIONS: I will provide an auto BiPAP ST with a pressure range of 5 cm spread - 12/7 cm, ST 10. Medium AirFit F20 full face mask. ?I want this patient to avoid supine sleep!  ?? ?1.  DISCUSSION: A follow up appointment will be scheduled in the Sleep Clinic at [facility].  BiPAP therapy compliance is defined as 4 hours or more with nightly use.  Please call [phone] with any questions.    ?

## 2021-11-05 ENCOUNTER — Encounter: Payer: Self-pay | Admitting: *Deleted

## 2021-11-05 ENCOUNTER — Encounter: Payer: Self-pay | Admitting: Neurology

## 2021-11-05 NOTE — Telephone Encounter (Signed)
Pt returned the call regarding sleep study results. Would like a call back. ?

## 2021-11-05 NOTE — Telephone Encounter (Signed)
Called pt back. I called pt. I advised pt that Dr. Brett Fairy reviewed their sleep study results and found that pt has sleep apnea and recommends that pt get started on Auto-bipap. I reviewed PAP compliance expectations with the pt. Pt is agreeable to starting a CPAP. I advised pt that an order will be sent to a DME, [facility], and [facility] will call the pt within about one week after they file with the pt's insurance. [facility] will show the pt how to use the machine, fit for masks, and troubleshoot the CPAP if needed. A follow up appt was made for insurance purposes with Dr. Brett Fairy on 01/23/22 at 1:30pm. Pt verbalized understanding to arrive 15 minutes early and bring their CPAP. A letter with all of this information in it will be mailed to the pt as a reminder. I verified with the pt that the address we have on file is correct. Pt verbalized understanding of results. Pt had no questions at this time but was encouraged to call back if questions arise. I have sent the order to [facility] and have received confirmation that they have received the order. ? ?

## 2021-11-07 DIAGNOSIS — M5032 Other cervical disc degeneration, mid-cervical region, unspecified level: Secondary | ICD-10-CM | POA: Diagnosis not present

## 2021-11-07 DIAGNOSIS — M9903 Segmental and somatic dysfunction of lumbar region: Secondary | ICD-10-CM | POA: Diagnosis not present

## 2021-11-07 DIAGNOSIS — M62830 Muscle spasm of back: Secondary | ICD-10-CM | POA: Diagnosis not present

## 2021-11-07 DIAGNOSIS — M9901 Segmental and somatic dysfunction of cervical region: Secondary | ICD-10-CM | POA: Diagnosis not present

## 2021-11-09 DIAGNOSIS — M9903 Segmental and somatic dysfunction of lumbar region: Secondary | ICD-10-CM | POA: Diagnosis not present

## 2021-11-09 DIAGNOSIS — M5032 Other cervical disc degeneration, mid-cervical region, unspecified level: Secondary | ICD-10-CM | POA: Diagnosis not present

## 2021-11-09 DIAGNOSIS — M62830 Muscle spasm of back: Secondary | ICD-10-CM | POA: Diagnosis not present

## 2021-11-09 DIAGNOSIS — M9901 Segmental and somatic dysfunction of cervical region: Secondary | ICD-10-CM | POA: Diagnosis not present

## 2021-11-12 DIAGNOSIS — M62830 Muscle spasm of back: Secondary | ICD-10-CM | POA: Diagnosis not present

## 2021-11-12 DIAGNOSIS — M9901 Segmental and somatic dysfunction of cervical region: Secondary | ICD-10-CM | POA: Diagnosis not present

## 2021-11-12 DIAGNOSIS — M9903 Segmental and somatic dysfunction of lumbar region: Secondary | ICD-10-CM | POA: Diagnosis not present

## 2021-11-12 DIAGNOSIS — M5032 Other cervical disc degeneration, mid-cervical region, unspecified level: Secondary | ICD-10-CM | POA: Diagnosis not present

## 2021-11-13 DIAGNOSIS — I861 Scrotal varices: Secondary | ICD-10-CM | POA: Diagnosis not present

## 2021-11-22 DIAGNOSIS — M9903 Segmental and somatic dysfunction of lumbar region: Secondary | ICD-10-CM | POA: Diagnosis not present

## 2021-11-22 DIAGNOSIS — M9901 Segmental and somatic dysfunction of cervical region: Secondary | ICD-10-CM | POA: Diagnosis not present

## 2021-11-22 DIAGNOSIS — M5032 Other cervical disc degeneration, mid-cervical region, unspecified level: Secondary | ICD-10-CM | POA: Diagnosis not present

## 2021-11-22 DIAGNOSIS — M62830 Muscle spasm of back: Secondary | ICD-10-CM | POA: Diagnosis not present

## 2021-11-22 IMAGING — CT CT CARDIAC CORONARY ARTERY CALCIUM SCORE
3 series · 14 of 20 positions shown, 16 images · non-contrast
Comparison: None.

CLINICAL DATA: Hyperlipidemia

EXAM:
CT CARDIAC CORONARY ARTERY CALCIUM SCORE
TECHNIQUE: Non-contrast imaging through the heart was performed using
prospective ECG gating. Image post processing was performed on an
independent workstation, allowing for quantitative analysis of the
heart and coronary arteries. Note that this exam targets the heart
and the chest was not imaged in its entirety.

[Series 2: calcium scoring 2.00 qr36 bestdiast 72% hrt calciu · axial · 0.45mm/px · z∈[+1720,+1780]mm · 4 of 50 slices shown]
[im 10/50  vessel]
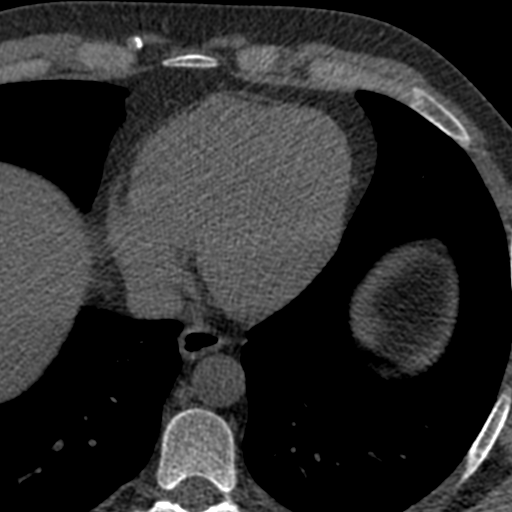
[im 20/50  vessel]
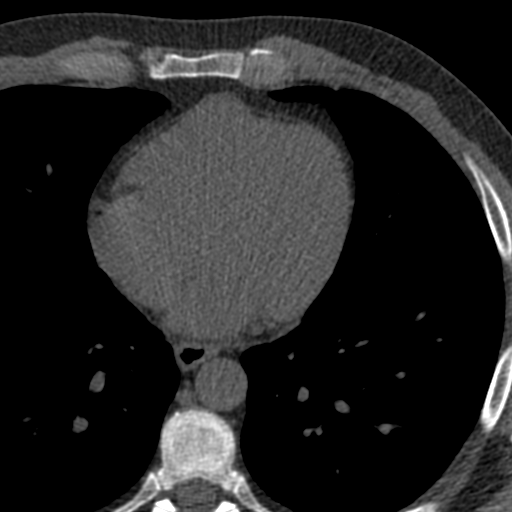
[im 30/50  vessel]
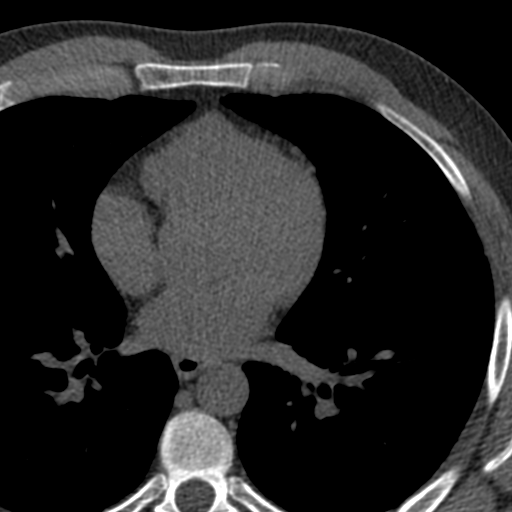
[im 40/50  vessel]
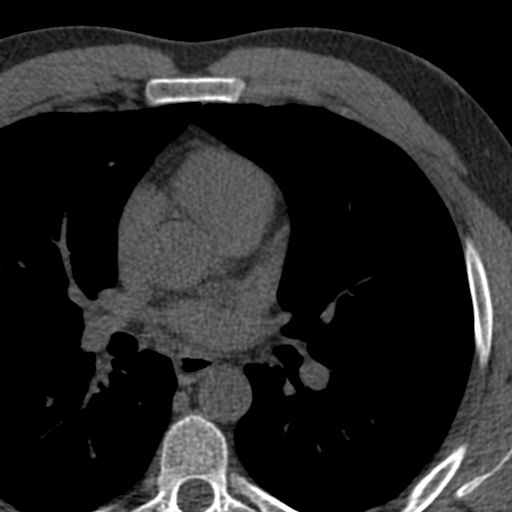

[Series 3: calcium scoring 2.00 br40 bestdiast 72% axial · axial · 0.63mm/px · z∈[+1718,+1782]mm · 5 of 50 slices shown, 7 images]
[im 9/50  vessel]
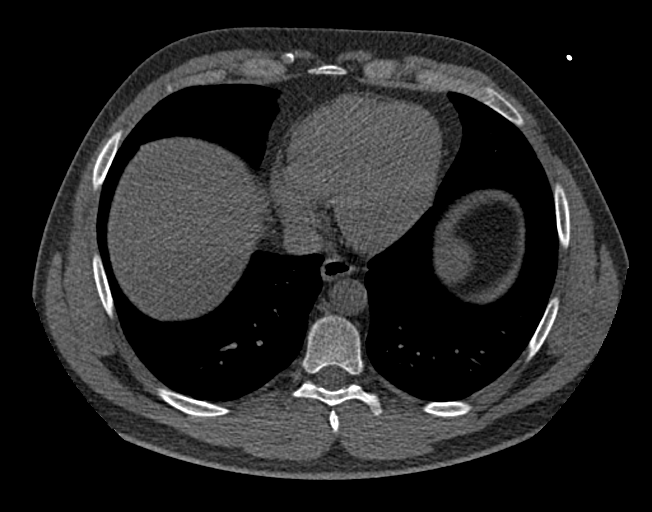
[im 9/50  lung]
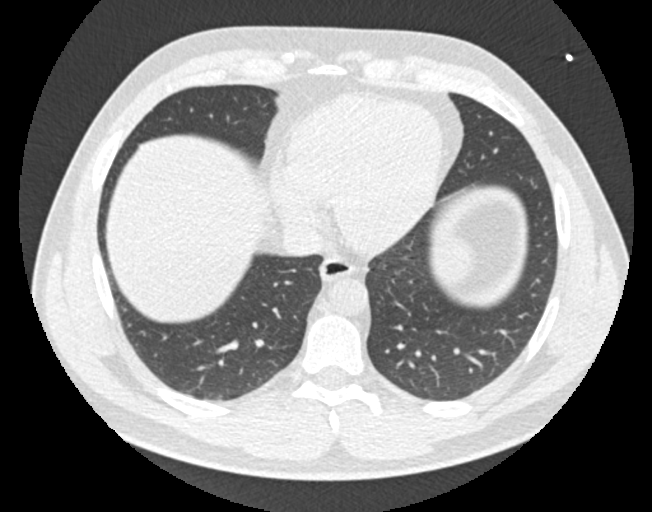
[im 17/50  vessel]
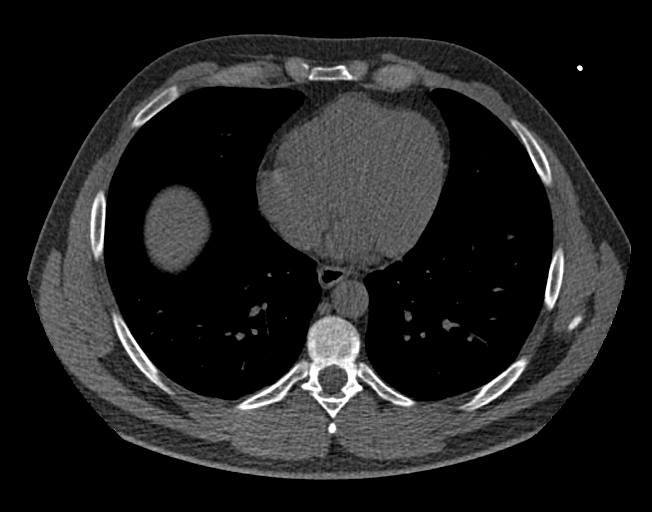
[im 25/50  vessel]
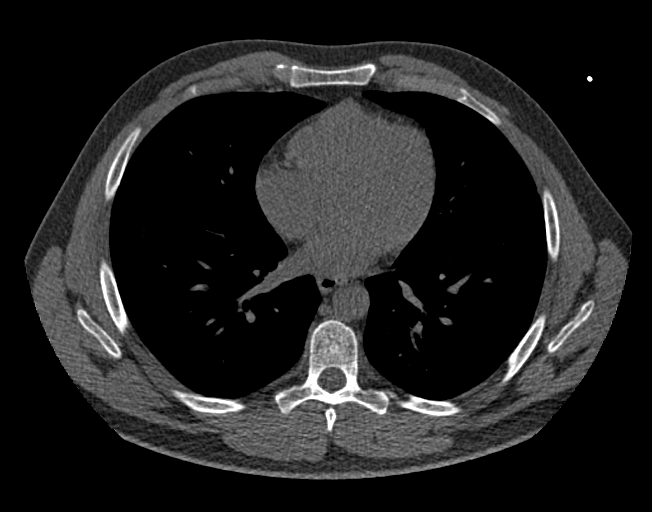
[im 33/50  vessel]
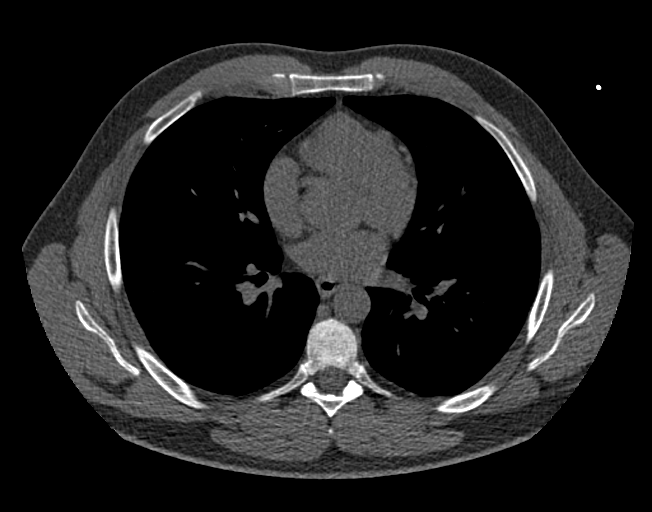
[im 41/50  vessel]
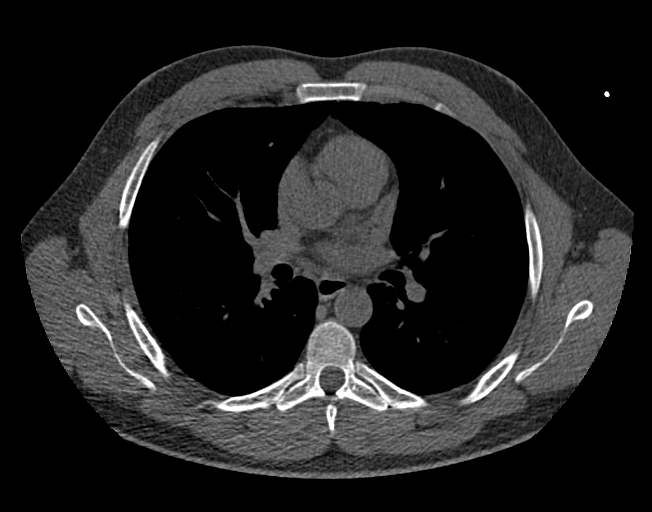
[im 41/50  lung]
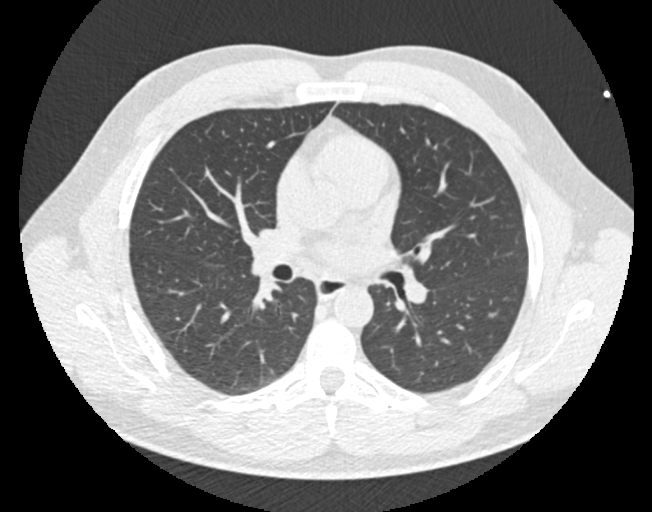

[Series 9: calcium scoring 2.00 br60 bestdiast 72% lungs · axial · 0.63mm/px · z∈[+1718,+1782]mm · 5 of 50 slices shown]
[im 9/50  vessel]
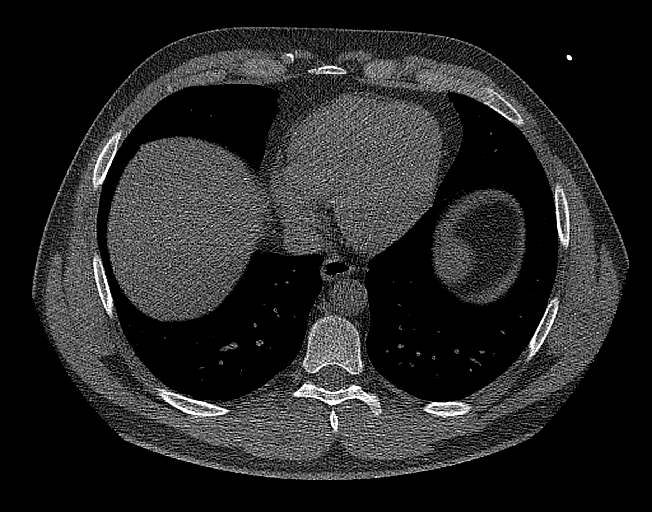
[im 17/50  vessel]
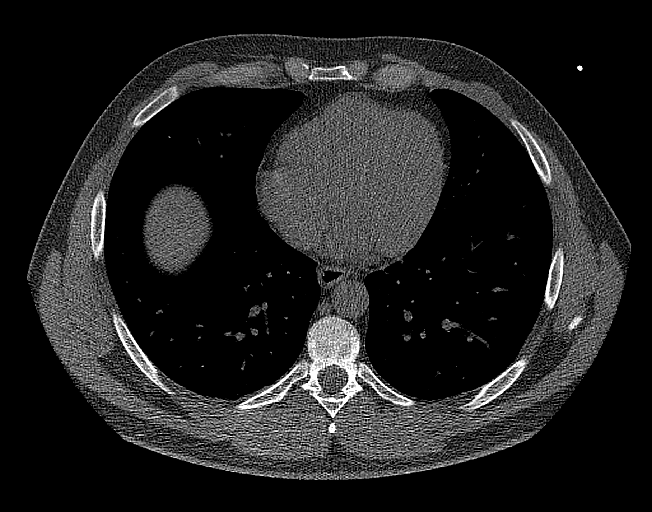
[im 25/50  vessel]
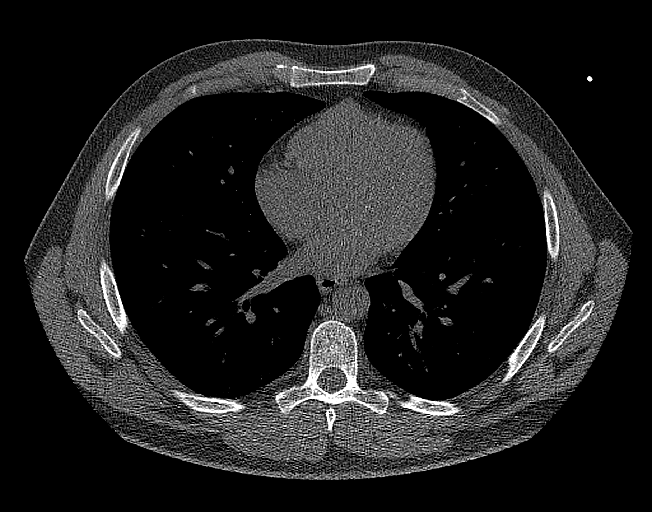
[im 33/50  vessel]
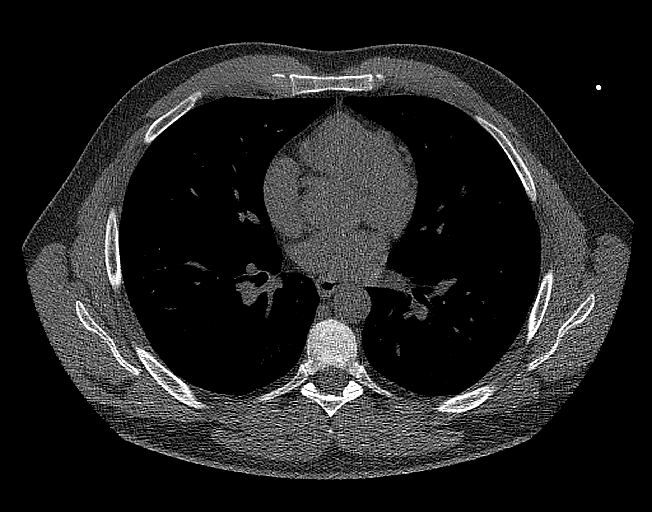
[im 41/50  vessel]
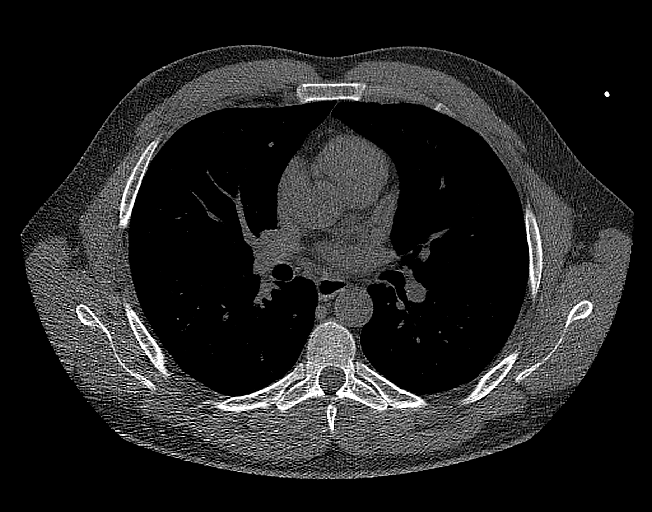

[14 of 20 positions shown; findings below may reference images not displayed]

FINDINGS: CORONARY CALCIUM SCORES:

Left Main: 0

LAD: 0

LCx: 0

RCA: 0

Total Agatston Score: 0

[HOSPITAL] percentile: 0

AORTA MEASUREMENTS:

Ascending Aorta: 31 mm

Descending Aorta: 24 mm

OTHER FINDINGS:

Heart is normal size. Aorta normal caliber. No adenopathy.
Visualized lungs clear. Imaging into the upper abdomen demonstrates
no acute findings. Chest wall soft tissues are unremarkable. No
acute bony abnormality.
IMPRESSION: No visible coronary artery calcifications. Total coronary calcium
score of 0.

No acute or significant extracardiac abnormality.

## 2021-11-25 DIAGNOSIS — G4733 Obstructive sleep apnea (adult) (pediatric): Secondary | ICD-10-CM | POA: Diagnosis not present

## 2021-11-27 DIAGNOSIS — M5451 Vertebrogenic low back pain: Secondary | ICD-10-CM | POA: Diagnosis not present

## 2021-11-29 ENCOUNTER — Telehealth: Payer: Self-pay | Admitting: Neurology

## 2021-11-29 DIAGNOSIS — M5032 Other cervical disc degeneration, mid-cervical region, unspecified level: Secondary | ICD-10-CM | POA: Diagnosis not present

## 2021-11-29 DIAGNOSIS — M9903 Segmental and somatic dysfunction of lumbar region: Secondary | ICD-10-CM | POA: Diagnosis not present

## 2021-11-29 DIAGNOSIS — M62830 Muscle spasm of back: Secondary | ICD-10-CM | POA: Diagnosis not present

## 2021-11-29 DIAGNOSIS — M9901 Segmental and somatic dysfunction of cervical region: Secondary | ICD-10-CM | POA: Diagnosis not present

## 2021-11-29 NOTE — Telephone Encounter (Signed)
Called the pt back and advised that in reviewing the dl it looks like he is having mask leaks which can play a factor in why he is having that sensation of a panic attack. Advised we would need to work on getting a good seal of the mask. Pt states that he tosses and turns a lot in sleep and that may be why he gets the leaks. I informed him that he would need to reach out to [facility] and see if they can help him with getting the leaks better controlled. I informed him of the dreamwear headgear that may be a better option for him. Pt verbalized understanding. Pt had no questions at this time but was encouraged to call back if questions arise.

## 2021-11-29 NOTE — Telephone Encounter (Signed)
Pt states since he has been using the CPAP due to air seeping thru he has been having panic attacks since day one and he would like a call to discuss. Pt made aware Dr Brett Fairy is out of the office until 06-13

## 2021-12-05 ENCOUNTER — Telehealth: Payer: Self-pay | Admitting: Neurology

## 2021-12-05 DIAGNOSIS — M5032 Other cervical disc degeneration, mid-cervical region, unspecified level: Secondary | ICD-10-CM | POA: Diagnosis not present

## 2021-12-05 DIAGNOSIS — M62830 Muscle spasm of back: Secondary | ICD-10-CM | POA: Diagnosis not present

## 2021-12-05 DIAGNOSIS — M9903 Segmental and somatic dysfunction of lumbar region: Secondary | ICD-10-CM | POA: Diagnosis not present

## 2021-12-05 DIAGNOSIS — M9901 Segmental and somatic dysfunction of cervical region: Secondary | ICD-10-CM | POA: Diagnosis not present

## 2021-12-05 NOTE — Telephone Encounter (Signed)
Conversation from [place], [place] 11/29/21: "Called the pt back and advised that in reviewing the dl it looks like he is having mask leaks which can play a factor in why he is having that sensation of a panic attack. Advised we would need to work on getting a good seal of the mask. Pt states that he tosses and turns a lot in sleep and that may be why he gets the leaks. I informed him that he would need to reach out to [facility] and see if they can help him with getting the leaks better controlled. I informed him of the dreamwear headgear that may be a better option for him. Pt verbalized understanding.Pt had no questions at this time but was encouraged to call back if questions arise."  I called pt to further discuss. He confirmed he called [facility]. Since 11/29/21, they have tried a few different masks. He is a mouth breather/needs one to cover mouth. Air leaks are better. However, he is still having panic attacks when he uses CPAP. He states he never had issue w/ panic attacks before, this is new w/ CPAP usage.  He is currently scheduled 07/24 for initial f/u. I offered sooner appt with Dr. Brett Fairy 12/11/21 at 11am or 3pm but he was unable to accept. He asked about following week but no appt available. I added him to wait list. He is aware he will be notified via mychart if any sooner appt become available.

## 2021-12-05 NOTE — Telephone Encounter (Signed)
Pt called stating that when he puts on his Bipap machine on it triggers a panic attack and is not able to try again until like an hour later. Pt would like to know if there is any medication that he could take to keep him calm. Please advise.

## 2021-12-14 DIAGNOSIS — N50812 Left testicular pain: Secondary | ICD-10-CM | POA: Diagnosis not present

## 2021-12-14 DIAGNOSIS — M5451 Vertebrogenic low back pain: Secondary | ICD-10-CM | POA: Diagnosis not present

## 2021-12-16 DIAGNOSIS — R42 Dizziness and giddiness: Secondary | ICD-10-CM | POA: Diagnosis not present

## 2022-01-03 DIAGNOSIS — N50812 Left testicular pain: Secondary | ICD-10-CM | POA: Diagnosis not present

## 2022-01-07 ENCOUNTER — Encounter (INDEPENDENT_AMBULATORY_CARE_PROVIDER_SITE_OTHER): Payer: Self-pay

## 2022-01-07 DIAGNOSIS — M47816 Spondylosis without myelopathy or radiculopathy, lumbar region: Secondary | ICD-10-CM | POA: Diagnosis not present

## 2022-01-08 ENCOUNTER — Ambulatory Visit: Payer: 59 | Admitting: Neurology

## 2022-01-08 ENCOUNTER — Other Ambulatory Visit: Payer: Self-pay | Admitting: *Deleted

## 2022-01-08 DIAGNOSIS — R42 Dizziness and giddiness: Secondary | ICD-10-CM

## 2022-01-09 ENCOUNTER — Ambulatory Visit (INDEPENDENT_AMBULATORY_CARE_PROVIDER_SITE_OTHER): Payer: 59 | Admitting: Family Medicine

## 2022-01-09 DIAGNOSIS — M62830 Muscle spasm of back: Secondary | ICD-10-CM | POA: Diagnosis not present

## 2022-01-09 DIAGNOSIS — M9901 Segmental and somatic dysfunction of cervical region: Secondary | ICD-10-CM | POA: Diagnosis not present

## 2022-01-09 DIAGNOSIS — M9903 Segmental and somatic dysfunction of lumbar region: Secondary | ICD-10-CM | POA: Diagnosis not present

## 2022-01-09 DIAGNOSIS — M5032 Other cervical disc degeneration, mid-cervical region, unspecified level: Secondary | ICD-10-CM | POA: Diagnosis not present

## 2022-01-17 DIAGNOSIS — Z0289 Encounter for other administrative examinations: Secondary | ICD-10-CM

## 2022-01-18 DIAGNOSIS — M792 Neuralgia and neuritis, unspecified: Secondary | ICD-10-CM | POA: Diagnosis not present

## 2022-01-18 DIAGNOSIS — N50819 Testicular pain, unspecified: Secondary | ICD-10-CM | POA: Diagnosis not present

## 2022-01-21 ENCOUNTER — Ambulatory Visit (INDEPENDENT_AMBULATORY_CARE_PROVIDER_SITE_OTHER): Payer: 59 | Admitting: Family Medicine

## 2022-01-21 ENCOUNTER — Encounter (INDEPENDENT_AMBULATORY_CARE_PROVIDER_SITE_OTHER): Payer: Self-pay | Admitting: Family Medicine

## 2022-01-21 VITALS — BP 110/78 | HR 107 | Temp 98.9°F | Ht 69.0 in | Wt 204.0 lb

## 2022-01-21 DIAGNOSIS — R0602 Shortness of breath: Secondary | ICD-10-CM

## 2022-01-21 DIAGNOSIS — F329 Major depressive disorder, single episode, unspecified: Secondary | ICD-10-CM

## 2022-01-21 DIAGNOSIS — Z1331 Encounter for screening for depression: Secondary | ICD-10-CM | POA: Diagnosis not present

## 2022-01-21 DIAGNOSIS — R5383 Other fatigue: Secondary | ICD-10-CM | POA: Diagnosis not present

## 2022-01-21 DIAGNOSIS — E669 Obesity, unspecified: Secondary | ICD-10-CM | POA: Diagnosis not present

## 2022-01-21 DIAGNOSIS — E785 Hyperlipidemia, unspecified: Secondary | ICD-10-CM | POA: Diagnosis not present

## 2022-01-21 DIAGNOSIS — Z6830 Body mass index (BMI) 30.0-30.9, adult: Secondary | ICD-10-CM | POA: Diagnosis not present

## 2022-01-21 DIAGNOSIS — G4733 Obstructive sleep apnea (adult) (pediatric): Secondary | ICD-10-CM

## 2022-01-21 DIAGNOSIS — Z9989 Dependence on other enabling machines and devices: Secondary | ICD-10-CM

## 2022-01-21 NOTE — Progress Notes (Deleted)
Patient: Erik Walker Date of Birth: 05-15-1986  Reason for Visit: Follow up History from: Patient Primary Neurologist:    ASSESSMENT AND PLAN 36 y.o. year old male    HISTORY OF PRESENT ILLNESS: Today 01/21/22  HISTORY   REVIEW OF SYSTEMS: Out of a complete 14 system review of symptoms, the patient complains only of the following symptoms, and all other reviewed systems are negative.  See HPI  ALLERGIES: Allergies  Allergen Reactions   Zoloft [Sertraline Hcl] Other (See Comments)   Wellbutrin [Bupropion] Rash    HOME MEDICATIONS: Outpatient Medications Prior to Visit  Medication Sig Dispense Refill   atorvastatin (LIPITOR) 20 MG tablet Take 20 mg by mouth daily. (Patient not taking: Reported on 01/21/2022)     clonazePAM (KLONOPIN) 0.5 MG tablet Take 1 mg by mouth 2 (two) times daily as needed for anxiety.      mirtazapine (REMERON) 30 MG tablet Take 30 mg by mouth at bedtime.      venlafaxine XR (EFFEXOR-XR) 150 MG 24 hr capsule Take 300 mg by mouth daily.      No facility-administered medications prior to visit.    PAST MEDICAL HISTORY: Past Medical History:  Diagnosis Date   Androgen deficiency 08/16/2013   from medical record Dr Shelia Media   Body mass index 34.0-34.9, adult 12/08/2018   Dr Shelia Media   Depression with anxiety 06/25/2017   Dr Shelia Media   Esophageal reflux 08/25/2018   Dr Shelia Media   GERD (gastroesophageal reflux disease)    Hypercalcemia 12/17/2018   Dr Shelia Media   Liver enzyme elevation    Major depressive disorder 06/25/2017   Dr Shelia Media   Mixed hyperlipidemia 12/17/2018   Dr Shelia Media   Nausea 08/25/2018   Dr Shelia Media   OSA on CPAP    Primary insomnia 04/28/2018   Dr Shelia Media    PAST SURGICAL HISTORY: Past Surgical History:  Procedure Laterality Date   ROTATOR CUFF REPAIR Right 2013   Dr Tanda Rockers   SHOULDER SURGERY     RIGHT x2   wrist ligament repair Left     FAMILY HISTORY: No family history on file.  SOCIAL HISTORY: Social History    Socioeconomic History   Marital status: Single    Spouse name: Not on file   Number of children: Not on file   Years of education: Not on file   Highest education level: Not on file  Occupational History   Not on file  Tobacco Use   Smoking status: Never   Smokeless tobacco: Never  Vaping Use   Vaping Use: Never used  Substance and Sexual Activity   Alcohol use: No   Drug use: No   Sexual activity: Not on file  Other Topics Concern   Not on file  Social History Narrative   Not on file   Social Determinants of Health   Financial Resource Strain: Not on file  Food Insecurity: Not on file  Transportation Needs: Not on file  Physical Activity: Not on file  Stress: Not on file  Social Connections: Not on file  Intimate Partner Violence: Not on file    PHYSICAL EXAM  There were no vitals filed for this visit. There is no height or weight on file to calculate BMI.  Generalized: Well developed, in no acute distress  Neurological examination  Mentation: Alert oriented to time, place, history taking. Follows all commands speech and language fluent Cranial nerve II-XII: Pupils were equal round reactive to light. Extraocular movements were full, visual  field were full on confrontational test. Facial sensation and strength were normal. Uvula tongue midline. Head turning and shoulder shrug  were normal and symmetric. Motor: The motor testing reveals 5 over 5 strength of all 4 extremities. Good symmetric motor tone is noted throughout.  Sensory: Sensory testing is intact to soft touch on all 4 extremities. No evidence of extinction is noted.  Coordination: Cerebellar testing reveals good finger-nose-finger and heel-to-shin bilaterally.  Gait and station: Gait is normal. Tandem gait is normal. Romberg is negative. No drift is seen.  Reflexes: Deep tendon reflexes are symmetric and normal bilaterally.   DIAGNOSTIC DATA (LABS, IMAGING, TESTING) - I reviewed patient records, labs,  notes, testing and imaging myself where available.  Lab Results  Component Value Date   WBC 10.4 04/25/2010   HGB 14.6 04/25/2010   HCT 42.9 04/25/2010   MCV 85.5 04/25/2010   PLT 267 04/25/2010      Component Value Date/Time   NA 140 04/25/2010 0705   K 3.8 04/25/2010 0705   CL 104 04/25/2010 0705   CO2 28 04/25/2010 0705   GLUCOSE 101 (H) 04/25/2010 0705   BUN 10 04/25/2010 0705   CREATININE 0.92 04/25/2010 0705   CALCIUM 9.5 04/25/2010 0705   GFRNONAA >60 04/25/2010 0705   GFRAA  04/25/2010 0705    >60        The eGFR has been calculated using the MDRD equation. This calculation has not been validated in all clinical situations. eGFR's persistently <60 mL/min signify possible Chronic Kidney Disease.   No results found for: "CHOL", "HDL", "[place]", "LDLDIRECT", "TRIG", "CHOLHDL" No results found for: "HGBA1C" No results found for: "VITAMINB12" No results found for: "TSH"  Butler Denmark, AGNP-C, DNP 01/21/2022, 4:34 PM [facility] [address], [place], [place] [address] [phone]

## 2022-01-22 ENCOUNTER — Encounter: Payer: 59 | Admitting: Neurology

## 2022-01-22 ENCOUNTER — Telehealth: Payer: Self-pay | Admitting: Neurology

## 2022-01-22 ENCOUNTER — Encounter (INDEPENDENT_AMBULATORY_CARE_PROVIDER_SITE_OTHER): Payer: Self-pay | Admitting: Family Medicine

## 2022-01-22 DIAGNOSIS — E785 Hyperlipidemia, unspecified: Secondary | ICD-10-CM | POA: Insufficient documentation

## 2022-01-22 NOTE — Telephone Encounter (Signed)
Pt cancelled appt due to having the flu. Transferred to Billing.

## 2022-01-23 ENCOUNTER — Ambulatory Visit (INDEPENDENT_AMBULATORY_CARE_PROVIDER_SITE_OTHER): Payer: 59 | Admitting: Family Medicine

## 2022-01-23 ENCOUNTER — Ambulatory Visit: Payer: 59 | Admitting: Neurology

## 2022-01-24 DIAGNOSIS — M47816 Spondylosis without myelopathy or radiculopathy, lumbar region: Secondary | ICD-10-CM | POA: Diagnosis not present

## 2022-01-26 ENCOUNTER — Other Ambulatory Visit: Payer: No Typology Code available for payment source

## 2022-01-26 LAB — LIPID PANEL
Chol/HDL Ratio: 5.9 ratio — ABNORMAL HIGH (ref 0.0–5.0)
Cholesterol, Total: 248 mg/dL — ABNORMAL HIGH (ref 100–199)
HDL: 42 mg/dL (ref >39)
LDL Chol Calc (NIH): 162 mg/dL — ABNORMAL HIGH (ref 0–99)
Triglycerides: 234 mg/dL — ABNORMAL HIGH (ref 0–149)
VLDL Cholesterol Cal: 44 mg/dL — ABNORMAL HIGH (ref 5–40)

## 2022-01-26 LAB — COMPREHENSIVE METABOLIC PANEL
ALT: 39 IU/L (ref 0–44)
AST: 22 IU/L (ref 0–40)
Albumin/Globulin Ratio: 2.1 (ref 1.2–2.2)
Albumin: 5.2 g/dL — ABNORMAL HIGH (ref 4.1–5.1)
Alkaline Phosphatase: 93 IU/L (ref 44–121)
BUN/Creatinine Ratio: 14 (ref 9–20)
BUN: 17 mg/dL (ref 6–20)
Bilirubin Total: 0.3 mg/dL (ref 0.0–1.2)
CO2: 24 mmol/L (ref 20–29)
Calcium: 10.6 mg/dL — ABNORMAL HIGH (ref 8.7–10.2)
Chloride: 98 mmol/L (ref 96–106)
Creatinine, Ser: 1.21 mg/dL (ref 0.76–1.27)
Globulin, Total: 2.5 g/dL (ref 1.5–4.5)
Glucose: 74 mg/dL (ref 70–99)
Potassium: 4.7 mmol/L (ref 3.5–5.2)
Sodium: 141 mmol/L (ref 134–144)
Total Protein: 7.7 g/dL (ref 6.0–8.5)
eGFR: 79 mL/min/{1.73_m2} (ref >59)

## 2022-01-26 LAB — CBC
Hematocrit: 48.9 % (ref 37.5–51.0)
Hemoglobin: 16.3 g/dL (ref 13.0–17.7)
MCH: 29 pg (ref 26.6–33.0)
MCHC: 33.3 g/dL (ref 31.5–35.7)
MCV: 87 fL (ref 79–97)
Platelets: 423 10*3/uL (ref 150–450)
RBC: 5.63 x10E6/uL (ref 4.14–5.80)
RDW: 14.4 % (ref 11.6–15.4)
WBC: 9.5 10*3/uL (ref 3.4–10.8)

## 2022-01-26 LAB — HEMOGLOBIN A1C
Est. average glucose Bld gHb Est-mCnc: 111 mg/dL
Hgb A1c MFr Bld: 5.5 % (ref 4.8–5.6)

## 2022-01-26 LAB — TSH+FREE T4
Free T4: 1.32 ng/dL (ref 0.82–1.77)
TSH: 1.62 u[IU]/mL (ref 0.450–4.500)

## 2022-01-26 LAB — INSULIN, FREE AND TOTAL
Free Insulin: 14 uU/mL
Total Insulin: 14 uU/mL

## 2022-01-26 LAB — TESTOSTERONE: Testosterone: 556 ng/dL (ref 264–916)

## 2022-01-26 LAB — VITAMIN D 25 HYDROXY (VIT D DEFICIENCY, FRACTURES): Vit D, 25-Hydroxy: 18.1 ng/mL — ABNORMAL LOW (ref 30.0–100.0)

## 2022-01-26 LAB — VITAMIN B12: Vitamin B-12: 403 pg/mL (ref 232–1245)

## 2022-01-29 NOTE — Progress Notes (Unsigned)
Chief Complaint:   OBESITY Erik Walker (MR# [id number]) is a 36 y.o. male who presents for evaluation and treatment of obesity and related comorbidities. Current BMI is Body mass index is 30.13 kg/m. Erik Walker has been struggling with his weight for many years and has been unsuccessful in either losing weight, maintaining weight loss, or reaching his healthy weight goal.  Erik Walker would like to lose 20 lbs. Started to gain weight in 2020 due to depression. Drinks soda daily. Sometimes skips breakfast. Started going to the gym 2-3 times a week. Never used weight loss medications.  Unemployed, divorced, and lives alone. Complains of fatigue.  Erik Walker is currently in the action stage of change and ready to dedicate time achieving and maintaining a healthier weight. Erik Walker is interested in becoming our patient and working on intensive lifestyle modifications including (but not limited to) diet and exercise for weight loss.  Erik Walker's habits were reviewed today and are as follows: he started gaining weight in August of 2020, his heaviest weight ever was 222 pounds, he is a picky eater and doesn't like to eat healthier foods, he has significant food cravings issues, he snacks frequently in the evenings, he skips meals frequently, he is frequently drinking liquids with calories, and he frequently makes poor food choices.  Depression Screen Ab's Food and Mood (modified PHQ-9) score was 7.     01/21/2022    7:54 AM  Depression screen PHQ 2/9  Decreased Interest 1  Down, Depressed, Hopeless 1  PHQ - 2 Score 2  Altered sleeping 1  Tired, decreased energy 2  Change in appetite 1  Feeling bad or failure about yourself  1  Trouble concentrating 0  Moving slowly or fidgety/restless 0  Suicidal thoughts 0  PHQ-9 Score 7  Difficult doing work/chores Somewhat difficult   Subjective:   1. Other fatigue Erik Walker admits to daytime somnolence and admits to waking up still tired. Patient has a history of  symptoms of daytime fatigue, morning fatigue, and morning headache. Erik Walker generally gets 8 hours of sleep per night, and states that he has generally restful sleep. Snoring is present. Apneic episodes are not present. Epworth Sleepiness Score is 2.    2. SOB (shortness of breath) Erik Walker notes increasing shortness of breath with exercising and seems to be worsening over time with weight gain. He notes getting out of breath sooner with activity than he used to. This has not gotten worse recently. Erik Walker denies shortness of breath at rest or orthopnea.  3. Major depressive disorder with current active episode, unspecified depression episode severity, unspecified whether recurrent Erik Walker reports stable mood with a good support system. He is on Venlafaxine 300 mg daily and klonopin as needed. PHQ-9 score is 7.  4. Hyperlipidemia, unspecified hyperlipidemia type Erik Walker has not been on Lipitor for >1 years. He denies chest pain.   5. OSA on CPAP [name]'s Epworth score is 2. He uses CPAP 8 hours per night.  Assessment/Plan:   1. Other fatigue Shoji does feel that his weight is causing his energy to be lower than it should be. Fatigue may be related to obesity, depression or many other causes. Labs will be ordered, and in the meanwhile, Bairon will focus on self care including making healthy food choices, increasing physical activity and focusing on stress reduction.  - EKG 12-Lead - Comprehensive metabolic panel - VITAMIN D 25 Hydroxy (Vit-D Deficiency, Fractures) - TSH + free T4 - Vitamin B12 - CBC - Hemoglobin A1c -  Testosterone - Insulin, Free and Total  2. SOB (shortness of breath) Erik Walker does feel that he gets out of breath more easily that he used to when he exercises. Erik Walker's shortness of breath appears to be obesity related and exercise induced. He has agreed to work on weight loss and gradually increase exercise to treat his exercise induced shortness of breath. Will continue to monitor  closely.  3. Major depressive disorder with current active episode, unspecified depression episode severity, unspecified whether recurrent Erik Walker will continue his current medications.   4. Hyperlipidemia, unspecified hyperlipidemia type We will check labs today, and we will follow-up at [name]'s next office visit.  - Lipid panel  5. OSA on CPAP [name] will continue his CPAP nightly.   6. Depression screening Erik Walker had a positive depression screening. Depression is commonly associated with obesity and often results in emotional eating behaviors. We will monitor this closely and work on CBT to help improve the non-hunger eating patterns. Referral to Psychology may be required if no improvement is seen as he continues in our clinic.  7. Class 1 obesity with serious comorbidity and body mass index (BMI) of 30.0 to 30.9 in adult, unspecified obesity type Erik Walker is currently in the action stage of change and his goal is to continue with weight loss efforts. I recommend Erik Walker begin the structured treatment plan as follows:  He has agreed to the Category 3 Plan and keeping a food journal and adhering to recommended goals of 1500-1600 calories and 100 protein daily.  Obtain labs today. Reviewed IC results. EKG-normal sinus rhythm at 99 BPM.   Exercise goals: As is, gym 3 times per week.   Behavioral modification strategies: increasing lean protein intake, decreasing liquid calories, no skipping meals, and better snacking choices.  He was informed of the importance of frequent follow-up visits to maximize his success with intensive lifestyle modifications for his multiple health conditions. He was informed we would discuss his lab results at his next visit unless there is a critical issue that needs to be addressed sooner. Erik Walker agreed to keep his next visit at the agreed upon time to discuss these results.  Objective:   Blood pressure 110/78, pulse (!) 107, temperature 98.9 F (37.2 C), height 5'  9" (1.753 m), weight 204 lb (92.5 kg), SpO2 93 %. Body mass index is 30.13 kg/m.  EKG: Normal sinus rhythm, rate 99 BPM.  Indirect Calorimeter completed today shows a VO2 of 299 and a REE of 2059.  His calculated basal metabolic rate is 2725 thus his basal metabolic rate is better than expected.  General: Cooperative, alert, well developed, in no acute distress. HEENT: Conjunctivae and lids unremarkable. Cardiovascular: Regular rhythm.  Lungs: Normal work of breathing. Neurologic: No focal deficits.   Lab Results  Component Value Date   CREATININE 1.21 01/21/2022   BUN 17 01/21/2022   NA 141 01/21/2022   K 4.7 01/21/2022   CL 98 01/21/2022   CO2 24 01/21/2022   Lab Results  Component Value Date   ALT 39 01/21/2022   AST 22 01/21/2022   ALKPHOS 93 01/21/2022   BILITOT 0.3 01/21/2022   Lab Results  Component Value Date   HGBA1C 5.5 01/21/2022   No results found for: "INSULIN" Lab Results  Component Value Date   TSH 1.620 01/21/2022   Lab Results  Component Value Date   CHOL 248 (H) 01/21/2022   HDL 42 01/21/2022   LDLCALC 162 (H) 01/21/2022   TRIG 234 (H) 01/21/2022  CHOLHDL 5.9 (H) 01/21/2022   Lab Results  Component Value Date   WBC 9.5 01/21/2022   HGB 16.3 01/21/2022   HCT 48.9 01/21/2022   MCV 87 01/21/2022   PLT 423 01/21/2022   No results found for: "IRON", "TIBC", "FERRITIN"  Attestation Statements:   Reviewed by clinician on day of visit: allergies, medications, problem list, medical history, surgical history, family history, social history, and previous encounter notes.  Wilhemena Durie, am acting as transcriptionist for Loyal Gambler, DO.  I have reviewed the above documentation for accuracy and completeness, and I agree with the above. Dell Ponto, DO

## 2022-01-30 NOTE — Progress Notes (Unsigned)
Patient: Erik Walker Date of Birth: 06-18-1986  Reason for Visit: Follow up History from: Patient Primary Neurologist: Dr. Brett Fairy   ASSESSMENT AND PLAN 36 y.o. year old male    HISTORY OF PRESENT ILLNESS: Today 01/30/22 Erik Walker is here today for follow-up. 11/16/20 had nocturnal polysomnography, showed severe central sleep apnea with AHI 50.1/h.  Recommended full night attended Pap titration, possibly changed to ASV. 10/25/21 at CPAP titration, primary central sleep apnea responded to BiPAP settings of 12/7 cmH2O ST 10.  Avoid supine sleep.  HISTORY  11/16/2020 Dr. Brett Fairy HISTORY:  Erik Walker presented on 03-20-2020 with an insomnia disorder, arising over the previous 3-4 month. He has a medical history of Androgen deficiency (08/16/2013), Body mass index 34.0-34.9, adult (12/08/2018), Depression with anxiety (06/25/2017), Esophageal reflux (08/25/2018), GERD (gastroesophageal reflux disease), Hypercalcemia (12/17/2018), Liver enzyme elevation, Major depressive disorder (06/25/2017), Mixed hyperlipidemia (12/17/2018), Nausea (08/25/2018), untreated OSA(?), and (06/19/7587)/ Cyclic insomnia, related to depression and stress.    The patient had the first sleep study in 11-2017- this was a home sleep test performed on 10 December 2017 through virtual [facility] home sleep testing.   BMI was 31.48, RDI was 7.5/h and AHI is not used in this particular report.  Oxygen nadir was 90% saturation which is fine and heart rate varied between 41 bpm and 122 bpm. there was really at best a mild sleep apnea noted, there was no associated cardiac irregularity or hypoxemia.   It seems that the apneas were central apneas in 15 out of 16 total apneas. This constellation usually would call for an attended sleep study to titrate to positive airway pressure.  The patient has had more anxiety when using CPAP and became non-compliant. Sleep relevant medical history: Anxiety related insomnia- recent sleep test was a HST-  Tonsillectomy in childhood, cervical spine injury.    The patient endorsed the Epworth Sleepiness Scale at 6 points.   The patient's weight 207 pounds with a height of 70 (inches), resulting in a BMI of 29.7 kg/m2. The patient's neck circumference measured 17 inches.   REVIEW OF SYSTEMS: Out of a complete 14 system review of symptoms, the patient complains only of the following symptoms, and all other reviewed systems are negative.  See HPI  ALLERGIES: Allergies  Allergen Reactions   Zoloft [Sertraline Hcl] Other (See Comments)   Wellbutrin [Bupropion] Rash    HOME MEDICATIONS: Outpatient Medications Prior to Visit  Medication Sig Dispense Refill   atorvastatin (LIPITOR) 20 MG tablet Take 20 mg by mouth daily. (Patient not taking: Reported on 01/21/2022)     clonazePAM (KLONOPIN) 0.5 MG tablet Take 1 mg by mouth 2 (two) times daily as needed for anxiety.      mirtazapine (REMERON) 30 MG tablet Take 30 mg by mouth at bedtime.      venlafaxine XR (EFFEXOR-XR) 150 MG 24 hr capsule Take 300 mg by mouth daily.      No facility-administered medications prior to visit.    PAST MEDICAL HISTORY: Past Medical History:  Diagnosis Date   Androgen deficiency 08/16/2013   from medical record Dr Shelia Media   Body mass index 34.0-34.9, adult 12/08/2018   Dr Shelia Media   Depression with anxiety 06/25/2017   Dr Shelia Media   Esophageal reflux 08/25/2018   Dr Shelia Media   GERD (gastroesophageal reflux disease)    Hypercalcemia 12/17/2018   Dr Shelia Media   Liver enzyme elevation    Major depressive disorder 06/25/2017   Dr Shelia Media   Mixed hyperlipidemia  12/17/2018   Dr Shelia Media   Nausea 08/25/2018   Dr Shelia Media   OSA on CPAP    Primary insomnia 04/28/2018   Dr Shelia Media    PAST SURGICAL HISTORY: Past Surgical History:  Procedure Laterality Date   ROTATOR CUFF REPAIR Right 2013   Dr Tanda Rockers   SHOULDER SURGERY     RIGHT x2   wrist ligament repair Left     FAMILY HISTORY: No family history on file.  SOCIAL  HISTORY: Social History   Socioeconomic History   Marital status: Single    Spouse name: Not on file   Number of children: Not on file   Years of education: Not on file   Highest education level: Not on file  Occupational History   Not on file  Tobacco Use   Smoking status: Never   Smokeless tobacco: Never  Vaping Use   Vaping Use: Never used  Substance and Sexual Activity   Alcohol use: No   Drug use: No   Sexual activity: Not on file  Other Topics Concern   Not on file  Social History Narrative   Not on file   Social Determinants of Health   Financial Resource Strain: Not on file  Food Insecurity: Not on file  Transportation Needs: Not on file  Physical Activity: Not on file  Stress: Not on file  Social Connections: Not on file  Intimate Partner Violence: Not on file    PHYSICAL EXAM  There were no vitals filed for this visit. There is no height or weight on file to calculate BMI.  Generalized: Well developed, in no acute distress  Neurological examination  Mentation: Alert oriented to time, place, history taking. Follows all commands speech and language fluent Cranial nerve II-XII: Pupils were equal round reactive to light. Extraocular movements were full, visual field were full on confrontational test. Facial sensation and strength were normal. Uvula tongue midline. Head turning and shoulder shrug  were normal and symmetric. Motor: The motor testing reveals 5 over 5 strength of all 4 extremities. Good symmetric motor tone is noted throughout.  Sensory: Sensory testing is intact to soft touch on all 4 extremities. No evidence of extinction is noted.  Coordination: Cerebellar testing reveals good finger-nose-finger and heel-to-shin bilaterally.  Gait and station: Gait is normal. Tandem gait is normal. Romberg is negative. No drift is seen.  Reflexes: Deep tendon reflexes are symmetric and normal bilaterally.   DIAGNOSTIC DATA (LABS, IMAGING, TESTING) - I reviewed  patient records, labs, notes, testing and imaging myself where available.  Lab Results  Component Value Date   WBC 9.5 01/21/2022   HGB 16.3 01/21/2022   HCT 48.9 01/21/2022   MCV 87 01/21/2022   PLT 423 01/21/2022      Component Value Date/Time   NA 141 01/21/2022 1441   K 4.7 01/21/2022 1441   CL 98 01/21/2022 1441   CO2 24 01/21/2022 1441   GLUCOSE 74 01/21/2022 1441   GLUCOSE 101 (H) 04/25/2010 0705   BUN 17 01/21/2022 1441   CREATININE 1.21 01/21/2022 1441   CALCIUM 10.6 (H) 01/21/2022 1441   PROT 7.7 01/21/2022 1441   ALBUMIN 5.2 (H) 01/21/2022 1441   AST 22 01/21/2022 1441   ALT 39 01/21/2022 1441   ALKPHOS 93 01/21/2022 1441   BILITOT 0.3 01/21/2022 1441   GFRNONAA >60 04/25/2010 0705   GFRAA  04/25/2010 0705    >60        The eGFR has been calculated using the MDRD  equation. This calculation has not been validated in all clinical situations. eGFR's persistently <60 mL/min signify possible Chronic Kidney Disease.   Lab Results  Component Value Date   CHOL 248 (H) 01/21/2022   HDL 42 01/21/2022   LDLCALC 162 (H) 01/21/2022   TRIG 234 (H) 01/21/2022   CHOLHDL 5.9 (H) 01/21/2022   Lab Results  Component Value Date   HGBA1C 5.5 01/21/2022   Lab Results  Component Value Date   GYFEETOL91 550 01/21/2022   Lab Results  Component Value Date   TSH 1.620 01/21/2022    Butler Denmark, AGNP-C, DNP 01/30/2022, 2:53 PM [facility] [address] [place], [place] [address] [phone]

## 2022-01-31 ENCOUNTER — Ambulatory Visit: Payer: 59 | Admitting: Neurology

## 2022-01-31 VITALS — BP 132/84 | HR 109 | Ht 70.0 in | Wt 221.0 lb

## 2022-01-31 DIAGNOSIS — G4731 Primary central sleep apnea: Secondary | ICD-10-CM | POA: Diagnosis not present

## 2022-01-31 DIAGNOSIS — F5104 Psychophysiologic insomnia: Secondary | ICD-10-CM

## 2022-01-31 DIAGNOSIS — R69 Illness, unspecified: Secondary | ICD-10-CM | POA: Diagnosis not present

## 2022-01-31 NOTE — Patient Instructions (Signed)
Please try to get back on bipap machine, I will send an order over to your supplier to see if mask refit could help, with head gear I will reach out in 4 weeks

## 2022-02-01 DIAGNOSIS — N50819 Testicular pain, unspecified: Secondary | ICD-10-CM | POA: Diagnosis not present

## 2022-02-04 ENCOUNTER — Ambulatory Visit (INDEPENDENT_AMBULATORY_CARE_PROVIDER_SITE_OTHER): Payer: 59 | Admitting: Family Medicine

## 2022-02-06 ENCOUNTER — Encounter (INDEPENDENT_AMBULATORY_CARE_PROVIDER_SITE_OTHER): Payer: Self-pay

## 2022-02-08 DIAGNOSIS — N50812 Left testicular pain: Secondary | ICD-10-CM | POA: Diagnosis not present

## 2022-02-12 ENCOUNTER — Ambulatory Visit (INDEPENDENT_AMBULATORY_CARE_PROVIDER_SITE_OTHER): Payer: 59 | Admitting: Family Medicine

## 2022-02-20 ENCOUNTER — Ambulatory Visit: Payer: 59 | Admitting: Neurology

## 2022-02-25 DIAGNOSIS — M9903 Segmental and somatic dysfunction of lumbar region: Secondary | ICD-10-CM | POA: Diagnosis not present

## 2022-02-25 DIAGNOSIS — M5032 Other cervical disc degeneration, mid-cervical region, unspecified level: Secondary | ICD-10-CM | POA: Diagnosis not present

## 2022-02-25 DIAGNOSIS — M9901 Segmental and somatic dysfunction of cervical region: Secondary | ICD-10-CM | POA: Diagnosis not present

## 2022-02-25 DIAGNOSIS — M62830 Muscle spasm of back: Secondary | ICD-10-CM | POA: Diagnosis not present

## 2022-03-05 ENCOUNTER — Encounter: Payer: Self-pay | Admitting: Neurology

## 2022-03-06 DIAGNOSIS — M9903 Segmental and somatic dysfunction of lumbar region: Secondary | ICD-10-CM | POA: Diagnosis not present

## 2022-03-06 DIAGNOSIS — M5032 Other cervical disc degeneration, mid-cervical region, unspecified level: Secondary | ICD-10-CM | POA: Diagnosis not present

## 2022-03-06 DIAGNOSIS — M9901 Segmental and somatic dysfunction of cervical region: Secondary | ICD-10-CM | POA: Diagnosis not present

## 2022-03-06 DIAGNOSIS — M62830 Muscle spasm of back: Secondary | ICD-10-CM | POA: Diagnosis not present

## 2022-03-08 DIAGNOSIS — M65862 Other synovitis and tenosynovitis, left lower leg: Secondary | ICD-10-CM | POA: Diagnosis not present

## 2022-03-08 DIAGNOSIS — M25562 Pain in left knee: Secondary | ICD-10-CM | POA: Diagnosis not present

## 2022-03-08 DIAGNOSIS — M65861 Other synovitis and tenosynovitis, right lower leg: Secondary | ICD-10-CM | POA: Diagnosis not present

## 2022-03-08 DIAGNOSIS — M25561 Pain in right knee: Secondary | ICD-10-CM | POA: Diagnosis not present

## 2022-03-19 DIAGNOSIS — M9903 Segmental and somatic dysfunction of lumbar region: Secondary | ICD-10-CM | POA: Diagnosis not present

## 2022-03-19 DIAGNOSIS — M62830 Muscle spasm of back: Secondary | ICD-10-CM | POA: Diagnosis not present

## 2022-03-19 DIAGNOSIS — M9901 Segmental and somatic dysfunction of cervical region: Secondary | ICD-10-CM | POA: Diagnosis not present

## 2022-03-19 DIAGNOSIS — M5032 Other cervical disc degeneration, mid-cervical region, unspecified level: Secondary | ICD-10-CM | POA: Diagnosis not present

## 2022-03-20 DIAGNOSIS — K580 Irritable bowel syndrome with diarrhea: Secondary | ICD-10-CM | POA: Diagnosis not present

## 2022-03-20 DIAGNOSIS — K219 Gastro-esophageal reflux disease without esophagitis: Secondary | ICD-10-CM | POA: Diagnosis not present

## 2022-03-26 DIAGNOSIS — T50905A Adverse effect of unspecified drugs, medicaments and biological substances, initial encounter: Secondary | ICD-10-CM | POA: Diagnosis not present

## 2022-03-26 DIAGNOSIS — R635 Abnormal weight gain: Secondary | ICD-10-CM | POA: Diagnosis not present

## 2022-03-26 DIAGNOSIS — G4733 Obstructive sleep apnea (adult) (pediatric): Secondary | ICD-10-CM | POA: Diagnosis not present

## 2022-03-26 DIAGNOSIS — R69 Illness, unspecified: Secondary | ICD-10-CM | POA: Diagnosis not present

## 2022-03-29 DIAGNOSIS — M62830 Muscle spasm of back: Secondary | ICD-10-CM | POA: Diagnosis not present

## 2022-03-29 DIAGNOSIS — M5032 Other cervical disc degeneration, mid-cervical region, unspecified level: Secondary | ICD-10-CM | POA: Diagnosis not present

## 2022-03-29 DIAGNOSIS — M9903 Segmental and somatic dysfunction of lumbar region: Secondary | ICD-10-CM | POA: Diagnosis not present

## 2022-03-29 DIAGNOSIS — M9901 Segmental and somatic dysfunction of cervical region: Secondary | ICD-10-CM | POA: Diagnosis not present

## 2022-04-01 ENCOUNTER — Ambulatory Visit: Payer: 59 | Admitting: Neurology

## 2022-04-04 DIAGNOSIS — M9903 Segmental and somatic dysfunction of lumbar region: Secondary | ICD-10-CM | POA: Diagnosis not present

## 2022-04-04 DIAGNOSIS — M5032 Other cervical disc degeneration, mid-cervical region, unspecified level: Secondary | ICD-10-CM | POA: Diagnosis not present

## 2022-04-04 DIAGNOSIS — M62830 Muscle spasm of back: Secondary | ICD-10-CM | POA: Diagnosis not present

## 2022-04-04 DIAGNOSIS — M9901 Segmental and somatic dysfunction of cervical region: Secondary | ICD-10-CM | POA: Diagnosis not present

## 2022-04-10 DIAGNOSIS — M62830 Muscle spasm of back: Secondary | ICD-10-CM | POA: Diagnosis not present

## 2022-04-10 DIAGNOSIS — M9903 Segmental and somatic dysfunction of lumbar region: Secondary | ICD-10-CM | POA: Diagnosis not present

## 2022-04-10 DIAGNOSIS — M5032 Other cervical disc degeneration, mid-cervical region, unspecified level: Secondary | ICD-10-CM | POA: Diagnosis not present

## 2022-04-10 DIAGNOSIS — M9901 Segmental and somatic dysfunction of cervical region: Secondary | ICD-10-CM | POA: Diagnosis not present

## 2022-04-11 DIAGNOSIS — N50819 Testicular pain, unspecified: Secondary | ICD-10-CM | POA: Diagnosis not present

## 2022-04-11 DIAGNOSIS — R1030 Lower abdominal pain, unspecified: Secondary | ICD-10-CM | POA: Diagnosis not present

## 2022-04-22 DIAGNOSIS — G4731 Primary central sleep apnea: Secondary | ICD-10-CM | POA: Diagnosis not present

## 2022-05-06 ENCOUNTER — Other Ambulatory Visit: Payer: Self-pay

## 2022-05-06 ENCOUNTER — Emergency Department (HOSPITAL_BASED_OUTPATIENT_CLINIC_OR_DEPARTMENT_OTHER)
Admission: EM | Admit: 2022-05-06 | Discharge: 2022-05-06 | Disposition: A | Discharge status: Home / Self Care | Payer: 59 | Attending: Emergency Medicine | Admitting: Emergency Medicine

## 2022-05-06 ENCOUNTER — Encounter (HOSPITAL_BASED_OUTPATIENT_CLINIC_OR_DEPARTMENT_OTHER): Payer: Self-pay

## 2022-05-06 DIAGNOSIS — R1013 Epigastric pain: Secondary | ICD-10-CM | POA: Insufficient documentation

## 2022-05-06 DIAGNOSIS — R7989 Other specified abnormal findings of blood chemistry: Secondary | ICD-10-CM | POA: Diagnosis not present

## 2022-05-06 DIAGNOSIS — R000 Tachycardia, unspecified: Secondary | ICD-10-CM | POA: Diagnosis not present

## 2022-05-06 DIAGNOSIS — D72829 Elevated white blood cell count, unspecified: Secondary | ICD-10-CM | POA: Insufficient documentation

## 2022-05-06 LAB — URINALYSIS, ROUTINE W REFLEX MICROSCOPIC
Bilirubin Urine: NEGATIVE
Glucose, UA: NEGATIVE mg/dL
Hgb urine dipstick: NEGATIVE
Ketones, ur: NEGATIVE mg/dL
Leukocytes,Ua: NEGATIVE
Nitrite: NEGATIVE
Protein, ur: NEGATIVE mg/dL
Specific Gravity, Urine: 1.012 (ref 1.005–1.030)
pH: 6 (ref 5.0–8.0)

## 2022-05-06 LAB — COMPREHENSIVE METABOLIC PANEL
ALT: 37 U/L (ref 0–44)
AST: 19 U/L (ref 15–41)
Albumin: 5 g/dL (ref 3.5–5.0)
Alkaline Phosphatase: 95 U/L (ref 38–126)
Anion gap: 13 (ref 5–15)
BUN: 10 mg/dL (ref 6–20)
CO2: 27 mmol/L (ref 22–32)
Calcium: 10.2 mg/dL (ref 8.9–10.3)
Chloride: 103 mmol/L (ref 98–111)
Creatinine, Ser: 1.36 mg/dL — ABNORMAL HIGH (ref 0.61–1.24)
GFR, Estimated: >60 mL/min (ref >60)
Glucose, Bld: 86 mg/dL (ref 70–99)
Potassium: 4 mmol/L (ref 3.5–5.1)
Sodium: 143 mmol/L (ref 135–145)
Total Bilirubin: 0.4 mg/dL (ref 0.3–1.2)
Total Protein: 7.9 g/dL (ref 6.5–8.1)

## 2022-05-06 LAB — CBC
HCT: 50.2 % (ref 39.0–52.0)
Hemoglobin: 16.5 g/dL (ref 13.0–17.0)
MCH: 28.7 pg (ref 26.0–34.0)
MCHC: 32.9 g/dL (ref 30.0–36.0)
MCV: 87.5 fL (ref 80.0–100.0)
Platelets: 391 10*3/uL (ref 150–400)
RBC: 5.74 MIL/uL (ref 4.22–5.81)
RDW: 12.9 % (ref 11.5–15.5)
WBC: 13.2 10*3/uL — ABNORMAL HIGH (ref 4.0–10.5)
nRBC: 0 % (ref 0.0–0.2)

## 2022-05-06 LAB — LIPASE, BLOOD: Lipase: 25 U/L (ref 11–51)

## 2022-05-06 LAB — TROPONIN I (HIGH SENSITIVITY): Troponin I (High Sensitivity): 3 ng/L (ref <18)

## 2022-05-06 MED ORDER — LIDOCAINE VISCOUS HCL 2 % MT SOLN
15.0000 mL | Freq: Once | OROMUCOSAL | Status: AC
  Administered 2022-05-06: 15 mL via ORAL
  Filled 2022-05-06: qty 15

## 2022-05-06 MED ORDER — SODIUM CHLORIDE 0.9 % IV BOLUS
500.0000 mL | Freq: Once | INTRAVENOUS | Status: AC
  Administered 2022-05-06: 500 mL via INTRAVENOUS

## 2022-05-06 MED ORDER — FAMOTIDINE 40 MG PO TABS: 20.0000 mg | ORAL_TABLET | Freq: Every day | ORAL | 0 refills | Status: AC

## 2022-05-06 MED ORDER — FAMOTIDINE 20 MG PO TABS
20.0000 mg | ORAL_TABLET | Freq: Once | ORAL | Status: AC
  Administered 2022-05-06: 20 mg via ORAL
  Filled 2022-05-06: qty 1

## 2022-05-06 MED ORDER — ALUM & MAG HYDROXIDE-SIMETH 200-200-20 MG/5ML PO SUSP
30.0000 mL | Freq: Once | ORAL | Status: AC
  Administered 2022-05-06: 30 mL via ORAL
  Filled 2022-05-06: qty 30

## 2022-05-06 NOTE — Discharge Instructions (Addendum)
Note the work-up today was overall reassuring.  As discussed, take Pepcid daily for your symptoms.  Recommend taking Maalox or the liquid that you drink while emergency department as needed; you can find this over-the-counter at your pharmacy.  Recommend staying away from NSAIDs such as ibuprofen/Aleve.  Follow-up recommended for reevaluation of your symptoms.  Please not hesitate to return to emergency department for worrisome signs and symptoms we discussed become apparent.

## 2022-05-06 NOTE — ED Provider Notes (Signed)
[facility] Provider Note   CSN: [id number] Arrival date & time: 05/06/22  1432     History  Chief Complaint  Patient presents with   Abdominal Pain    Erik Walker is a 36 y.o. male.   Abdominal Pain   36 year old male presents emergency department with complaints of epigastric abdominal pain.  Patient states that symptoms became present around 8 AM this morning when he was getting it for work.  Denies association with consumption of food/liquids.  Denies fever, chills, night sweats, chest pain, shortness of breath, nausea, vomiting, urinary symptoms, change in bowel habits, medic easier, melena.  States that symptoms been constant since onset.  Denies any exacerbating or relieving factors.  Reports history of similar symptoms when diagnosed with stomach ulcers in the past.  Denies history of abdominal surgeries.  Last bowel movement this morning.  Past medical history significant for esophageal reflux, major depressive disorder, hyperlipidemia, OSA  Home Medications Prior to Admission medications   Medication Sig Start Date End Date Taking? Authorizing Provider  famotidine (PEPCID) 40 MG tablet Take 0.5 tablets (20 mg total) by mouth daily. 05/06/22  Yes Dion Saucier A, PA  atorvastatin (LIPITOR) 20 MG tablet Take 20 mg by mouth daily.    [provider]  clonazePAM (KLONOPIN) 0.5 MG tablet Take 1 mg by mouth 2 (two) times daily as needed for anxiety.     [provider]  mirtazapine (REMERON) 30 MG tablet Take 30 mg by mouth at bedtime.     [provider]  venlafaxine XR (EFFEXOR-XR) 150 MG 24 hr capsule Take 300 mg by mouth daily.     [provider]      Allergies    Zoloft [sertraline hcl] and Wellbutrin [bupropion]    Review of Systems   Review of Systems  Gastrointestinal:  Positive for abdominal pain.  All other systems reviewed and are negative.   Physical Exam Updated Vital Signs BP (!) 134/92  (BP Location: Right Arm)   Pulse 86   Temp 98.1 F (36.7 C)   Resp 16   Ht '5\' 10"'$  (1.778 m)   Wt 95.3 kg   SpO2 97%   BMI 30.13 kg/m  Physical Exam Vitals and nursing note reviewed.  Constitutional:      General: He is not in acute distress.    Appearance: He is well-developed.  HENT:     Head: Normocephalic and atraumatic.  Eyes:     Conjunctiva/sclera: Conjunctivae normal.  Cardiovascular:     Rate and Rhythm: Normal rate and regular rhythm.     Heart sounds: No murmur heard. Pulmonary:     Effort: Pulmonary effort is normal. No respiratory distress.     Breath sounds: Normal breath sounds.  Abdominal:     Palpations: Abdomen is soft.     Tenderness: There is abdominal tenderness. There is no right CVA tenderness or left CVA tenderness.     Comments: Mild epigastric tenderness to palpation.  Musculoskeletal:        General: No swelling.     Cervical back: Neck supple.  Skin:    General: Skin is warm and dry.     Capillary Refill: Capillary refill takes less than 2 seconds.  Neurological:     Mental Status: He is alert.  Psychiatric:        Mood and Affect: Mood normal.     ED Results / Procedures / Treatments   Labs (all labs ordered are listed,  but only abnormal results are displayed) Labs Reviewed  COMPREHENSIVE METABOLIC PANEL - Abnormal; Notable for the following components:      Result Value   Creatinine, Ser 1.36 (*)    All other components within normal limits  CBC - Abnormal; Notable for the following components:   WBC 13.2 (*)    All other components within normal limits  LIPASE, BLOOD  URINALYSIS, ROUTINE W REFLEX MICROSCOPIC  TROPONIN I (HIGH SENSITIVITY)    EKG None  Radiology No results found.  Procedures Procedures    Medications Ordered in ED Medications  famotidine (PEPCID) tablet 20 mg (20 mg Oral Given 05/06/22 1808)  alum & mag hydroxide-simeth (MAALOX/MYLANTA) 200-200-20 MG/5ML suspension 30 mL (30 mLs Oral Given 05/06/22  1808)    And  lidocaine (XYLOCAINE) 2 % viscous mouth solution 15 mL (15 mLs Oral Given 05/06/22 1808)  sodium chloride 0.9 % bolus 500 mL (500 mLs Intravenous New Bag/Given 05/06/22 1805)    ED Course/ Medical Decision Making/ A&P                           Medical Decision Making Amount and/or Complexity of Data Reviewed Labs: ordered.  Risk OTC drugs. Prescription drug management.   This patient presents to the ED for concern of abdominal pain, this involves an extensive number of treatment options, and is a complaint that carries with it a high risk of complications and morbidity.  The differential diagnosis includes The causes of generalized abdominal pain include but are not limited to AAA, mesenteric ischemia, appendicitis, diverticulitis, DKA, gastritis, gastroenteritis, AMI, nephrolithiasis, pancreatitis, peritonitis, intestinal ischemia, constipation, UTI,SBO/LBO, splenic rupture, biliary disease, IBD, IBS, PUD, or hepatitis.  Co morbidities that complicate the patient evaluation  See HPI   Additional history obtained:  Additional history obtained from EMR External records from outside source obtained and reviewed including hospital records   Lab Tests:  I Ordered, and personally interpreted labs.  The pertinent results include: Mild leukocytosis of 13.2.  No evidence of anemia.  Platelets within normal range.  No electrolyte abnormalities noted.  Mild elevation of creatinine of 1.36 which is isolated in nature.  Renal function within normal limits.  No transaminitis noted.  Lipase within normal limits.  UA insignificant for acute abnormalities.  Initial troponin of 3; was ordered secondary to T wave inversions noted in inferior leads on EKG although very unlikely cardiac in nature.  Imaging Studies ordered:  Discussed with patient regarding holding off on imaging given reassuring physical exam and laboratory studies.  Patient agreed for therapy of medicines while in the  emergency department.   Cardiac Monitoring: / EKG:  The patient was maintained on a cardiac monitor.  I personally viewed and interpreted the cardiac monitored which showed an underlying rhythm of: Sinus tachycardia with T wave abnormalities in the inferior leads with no reciprocal changes.   Consultations Obtained:  N/a   Problem List / ED Course / Critical interventions / Medication management  Abdominal pain I ordered medication including 100 mL of normal saline, Xylocaine, Mylanta and Pepcid for GI cocktail.   Reevaluation of the patient after these medicines showed that the patient improved I have reviewed the patients home medicines and have made adjustments as needed   Social Determinants of Health:  Denies tobacco, illicit drug, alcohol use.   Test / Admission - Considered:  Abdominal pain Vitals signs significant for initial tachycardia with a rate of 102 which decreased with administration  of medicines as well as time elapsed on emergency department.. Otherwise within normal range and stable throughout visit. Laboratory studies significant for: See above Patient symptoms likely secondary to gastritis or peptic ulcer disease.  Patient noted near resolution of symptoms with administration of GI cocktail in the emergency department.  Continued outpatient therapy with famotidine and Maalox as needed recommended.  Follow-up with PCP/gastroenterologist recommended outpatient.  Recommended avoiding excessive alcohol use, NSAIDs.  Treatment plan discussed along with patient and he knowledge understanding was agreeable to said plan. Worrisome signs and symptoms were discussed with the patient, and the patient acknowledged understanding to return to the ED if noticed. Patient was stable upon discharge.          Final Clinical Impression(s) / ED Diagnoses Final diagnoses:  Epigastric abdominal pain    Rx / DC Orders ED Discharge Orders          Ordered    famotidine  (PEPCID) 40 MG tablet  Daily        05/06/22 1859              Wilnette Kales, [place] 05/06/22 1859    Tretha Sciara, MD 05/08/22 1458

## 2022-05-06 NOTE — ED Triage Notes (Signed)
Pt reports BIL upper abd pain since this morning along with some nausea. No vomiting or diarrhea.

## 2022-05-06 NOTE — ED Notes (Signed)
Discharge paperwork given and verbally understood. 

## 2022-05-10 DIAGNOSIS — N50812 Left testicular pain: Secondary | ICD-10-CM | POA: Diagnosis not present

## 2022-05-13 DIAGNOSIS — R1013 Epigastric pain: Secondary | ICD-10-CM | POA: Diagnosis not present

## 2022-05-13 DIAGNOSIS — K219 Gastro-esophageal reflux disease without esophagitis: Secondary | ICD-10-CM | POA: Diagnosis not present

## 2022-05-13 DIAGNOSIS — K259 Gastric ulcer, unspecified as acute or chronic, without hemorrhage or perforation: Secondary | ICD-10-CM | POA: Diagnosis not present

## 2022-05-20 DIAGNOSIS — R1013 Epigastric pain: Secondary | ICD-10-CM | POA: Diagnosis not present

## 2022-05-20 DIAGNOSIS — K219 Gastro-esophageal reflux disease without esophagitis: Secondary | ICD-10-CM | POA: Diagnosis not present

## 2022-05-23 DIAGNOSIS — G4731 Primary central sleep apnea: Secondary | ICD-10-CM | POA: Diagnosis not present

## 2022-05-29 DIAGNOSIS — G4733 Obstructive sleep apnea (adult) (pediatric): Secondary | ICD-10-CM | POA: Diagnosis not present

## 2022-06-18 DIAGNOSIS — N50819 Testicular pain, unspecified: Secondary | ICD-10-CM | POA: Diagnosis not present

## 2022-06-18 DIAGNOSIS — R1030 Lower abdominal pain, unspecified: Secondary | ICD-10-CM | POA: Diagnosis not present

## 2022-06-19 DIAGNOSIS — M5032 Other cervical disc degeneration, mid-cervical region, unspecified level: Secondary | ICD-10-CM | POA: Diagnosis not present

## 2022-06-19 DIAGNOSIS — M9903 Segmental and somatic dysfunction of lumbar region: Secondary | ICD-10-CM | POA: Diagnosis not present

## 2022-06-19 DIAGNOSIS — M62830 Muscle spasm of back: Secondary | ICD-10-CM | POA: Diagnosis not present

## 2022-06-19 DIAGNOSIS — M9901 Segmental and somatic dysfunction of cervical region: Secondary | ICD-10-CM | POA: Diagnosis not present

## 2022-06-22 DIAGNOSIS — G4731 Primary central sleep apnea: Secondary | ICD-10-CM | POA: Diagnosis not present

## 2022-06-28 DIAGNOSIS — G4733 Obstructive sleep apnea (adult) (pediatric): Secondary | ICD-10-CM | POA: Diagnosis not present

## 2022-08-08 ENCOUNTER — Ambulatory Visit: Payer: Self-pay | Admitting: Neurology

## 2022-10-29 ENCOUNTER — Ambulatory Visit: Payer: Self-pay | Admitting: Neurology

## 2022-10-29 ENCOUNTER — Ambulatory Visit: Payer: 59 | Admitting: Podiatry

## 2022-11-07 ENCOUNTER — Ambulatory Visit: Payer: 59 | Admitting: Podiatry

## 2022-11-20 ENCOUNTER — Encounter: Payer: Self-pay | Admitting: Podiatry

## 2022-11-20 ENCOUNTER — Ambulatory Visit: Payer: 59 | Admitting: Podiatry

## 2022-11-20 ENCOUNTER — Ambulatory Visit (INDEPENDENT_AMBULATORY_CARE_PROVIDER_SITE_OTHER): Payer: 59

## 2022-11-20 DIAGNOSIS — M722 Plantar fascial fibromatosis: Secondary | ICD-10-CM | POA: Diagnosis not present

## 2022-11-20 MED ORDER — DICLOFENAC SODIUM 75 MG PO TBEC: 75.0000 mg | DELAYED_RELEASE_TABLET | Freq: Two times a day (BID) | ORAL | 2 refills | Status: DC

## 2022-11-20 MED ORDER — TRIAMCINOLONE ACETONIDE 10 MG/ML IJ SUSP
10.0000 mg | Freq: Once | INTRAMUSCULAR | Status: AC
  Administered 2022-11-20: 10 mg

## 2022-11-20 NOTE — Patient Instructions (Signed)

## 2022-11-21 NOTE — Progress Notes (Signed)
Subjective:   Patient ID: Erik Walker, male   DOB: 37 y.o.   MRN: [id number]   HPI Patient states with a painful right heel that is been bothering him and it has been going on now for at least a month and he works at [employer] on [address].  Patient does not smoke likes to be active   Review of Systems  All other systems reviewed and are negative.       Objective:  Physical Exam Vitals and nursing note reviewed.  Constitutional:      Appearance: He is well-developed.  Pulmonary:     Effort: Pulmonary effort is normal.  Musculoskeletal:        General: Normal range of motion.  Skin:    General: Skin is warm.  Neurological:     Mental Status: He is alert.     Neurovascular status intact muscle strength found to be adequate range of motion within normal limits.  Patient is found to have acute discomfort in the medial fascial band of the plantar fascia attachment into the calcaneus with moderate flatfoot deformity noted.  Good digital perfusion well-oriented x 3     Assessment:  Acute Planter fasciitis right with structural changes of the arch contributory in a relatively young man     Plan:  H&P conditions reviewed sterile prep and injected the plantar fascia right 3 mg Kenalog 5 mg Xylocaine applied fascial brace fitted properly to the arch to lift up the arch and discussed long-term orthotics which we will review at next visit.  Patient to be seen back  X-rays indicate small spur at moderate depression of the arch no indication stress fracture arthritis

## 2023-02-10 ENCOUNTER — Other Ambulatory Visit: Payer: Self-pay | Admitting: Podiatry

## 2023-03-17 ENCOUNTER — Encounter: Payer: Self-pay | Admitting: Podiatry

## 2023-03-17 ENCOUNTER — Ambulatory Visit: Payer: 59 | Admitting: Podiatry

## 2023-03-17 DIAGNOSIS — M722 Plantar fascial fibromatosis: Secondary | ICD-10-CM | POA: Diagnosis not present

## 2023-03-17 MED ORDER — TRIAMCINOLONE ACETONIDE 10 MG/ML IJ SUSP
10.0000 mg | Freq: Once | INTRAMUSCULAR | Status: AC
  Administered 2023-03-17: 10 mg via INTRA_ARTICULAR

## 2023-03-17 NOTE — Progress Notes (Unsigned)
Patient was seen, measured for custom molded foot orthotics  Patient will benefit from CFO's as they will help provide total contact to MLA's helping to better distribute body weight across BIL feet greater reducing plantar pressure and pain and to also encourage FF and RF alignment  Patient was scanned items to be ordered and fit when in  [employer], CFo, CFm

## 2023-03-18 NOTE — Progress Notes (Signed)
Subjective:   Patient ID: Erik Walker, male   DOB: 37 y.o.   MRN: [id number]   HPI Patient presents stating that the heel has started to hurt again and he does walk all day on cement floors   ROS      Objective:  Physical Exam  Neurovascular status remains intact discomfort of an intense nature plantar aspect right heel at the insertional point tendon calcaneus with inflammation fluid around the medial band     Assessment:  Acute plantar fasciitis right with inflammation fluid around the medial band with structural foot deformity     Plan:  H&P reviewed today sterile prep injected the fascia again 3 mg Kenalog 5 mg Xylocaine at insertion of fascia and pedorthist evaluated and casted for functional orthotic devices today

## 2023-07-24 ENCOUNTER — Encounter: Payer: Self-pay | Admitting: Podiatry

## 2023-07-24 ENCOUNTER — Ambulatory Visit: Payer: 59 | Admitting: Podiatry

## 2023-07-24 VITALS — Ht 70.0 in | Wt 210.0 lb

## 2023-07-24 DIAGNOSIS — M722 Plantar fascial fibromatosis: Secondary | ICD-10-CM

## 2023-07-24 MED ORDER — TRIAMCINOLONE ACETONIDE 10 MG/ML IJ SUSP
10.0000 mg | Freq: Once | INTRAMUSCULAR | Status: AC
  Administered 2023-07-24: 10 mg via INTRA_ARTICULAR

## 2023-07-24 NOTE — Progress Notes (Signed)
Subjective:   Patient ID: Erik Walker, male   DOB: 38 y.o.   MRN: [id number]   HPI Patient presents stating his right heel has started to become very sore again making it hard for him to walk.  States that it has been very tender and gradually becoming more symptomatic also complaining that it hurts worse when he gets up or after periods of sitting F2 neurovascular status intact exquisite discomfort medial fascial band right insertional point tendon calcaneus with lack of good stretching being done   ROS      Objective:  Physical Exam  Reviewed condition with chronic fasciitis that only is getting better for several months at a time     Assessment:  Above is the assessment     Plan:  Reviewed condition sterile prep injected the fascia 3 mg Kenalog 5 mg Xylocaine applied night splint with all instructions on usage and ice therapy aggressively and dispensed his orthotics.  Reappoint as symptoms occur and educated him on orthotic usage

## 2024-03-05 ENCOUNTER — Ambulatory Visit: Admitting: Podiatry

## 2024-03-08 ENCOUNTER — Encounter: Payer: Self-pay | Admitting: Podiatry

## 2024-03-08 ENCOUNTER — Ambulatory Visit: Admitting: Podiatry

## 2024-03-08 ENCOUNTER — Ambulatory Visit (INDEPENDENT_AMBULATORY_CARE_PROVIDER_SITE_OTHER): Admitting: Podiatry

## 2024-03-08 VITALS — Ht 70.0 in | Wt 210.0 lb

## 2024-03-08 DIAGNOSIS — M722 Plantar fascial fibromatosis: Secondary | ICD-10-CM

## 2024-03-08 MED ORDER — TRIAMCINOLONE ACETONIDE 10 MG/ML IJ SUSP
10.0000 mg | Freq: Once | INTRAMUSCULAR | Status: AC
  Administered 2024-03-08: 10 mg via INTRA_ARTICULAR

## 2024-03-09 NOTE — Progress Notes (Signed)
 Subjective:   Patient ID: Erik Walker Cancer, male   DOB: 38 y.o.   MRN: [id number]   HPI Patient states the right heel is hurting again and making it hard to walk comfortably   ROS      Objective:  Physical Exam  Vascular status intact with pain in the right plantar fascia at the insertion of the tendon into the calcaneus     Assessment:  Reoccurrence plantar fasciitis right after 7 months of relief     Plan:  Sterile prep injected the plantar fascia at insertion 3 mg Kenalog  5 mg Xylocaine  applied sterile dressing reappoint routine care

## 2024-06-28 ENCOUNTER — Encounter: Payer: Self-pay | Admitting: Podiatry

## 2024-06-28 ENCOUNTER — Ambulatory Visit: Admitting: Podiatry

## 2024-06-28 DIAGNOSIS — M722 Plantar fascial fibromatosis: Secondary | ICD-10-CM | POA: Diagnosis not present

## 2024-06-28 MED ORDER — TRIAMCINOLONE ACETONIDE 10 MG/ML IJ SUSP
10.0000 mg | Freq: Once | INTRAMUSCULAR | Status: AC
  Administered 2024-06-28: 10 mg via INTRA_ARTICULAR

## 2024-06-29 ENCOUNTER — Telehealth: Payer: Self-pay | Admitting: Lab

## 2024-06-29 NOTE — Telephone Encounter (Signed)
 Patient states a medication was suppose to be sent in for future flare ups and nothing has been sent to pharmacy please advise.

## 2024-06-29 NOTE — Progress Notes (Signed)
 Subjective:   Patient ID: Erik Walker Cancer, male   DOB: 38 y.o.   MRN: [id number]   HPI Patient states he has developed a lot of pain again in his right plantar heel   ROS      Objective:  Physical Exam  Neurovascular status intact reoccurrence of discomfort right plantar fascia at insertion calcaneus     Assessment:  Cute plantar fasciitis right     Plan:  Reviewed condition sterile prep reinjected the plantar fascia right 3 mg Kenalog  5 mg Xylocaine  applied sterile dressing and instructed on continued good shoes and to use night splint orthotics

## 2024-06-30 ENCOUNTER — Other Ambulatory Visit: Payer: Self-pay | Admitting: Podiatry

## 2024-06-30 MED ORDER — DICLOFENAC SODIUM 75 MG PO TBEC: 75.0000 mg | DELAYED_RELEASE_TABLET | Freq: Two times a day (BID) | ORAL | 2 refills | Status: AC

## 2024-07-02 ENCOUNTER — Other Ambulatory Visit: Payer: Self-pay | Admitting: Lab

## 2024-07-02 MED ORDER — DICLOFENAC SODIUM 75 MG PO TBEC: 75.0000 mg | DELAYED_RELEASE_TABLET | Freq: Two times a day (BID) | ORAL | 2 refills | Status: AC

## 2024-07-02 NOTE — Telephone Encounter (Signed)
 Verified with Melissa on refill request.

## 2024-07-07 ENCOUNTER — Ambulatory Visit: Admitting: Podiatry
